# Patient Record
Sex: Female | Born: 1950 | Race: White | Hispanic: No | Marital: Single | Smoking: Former smoker
Health system: Southern US, Community
[De-identification: ages and names within clinical notes are randomized; demographics above are authoritative.]

## PROBLEM LIST (undated history)

## (undated) DIAGNOSIS — I10 Essential (primary) hypertension: Secondary | ICD-10-CM

## (undated) DIAGNOSIS — F32A Depression, unspecified: Secondary | ICD-10-CM

## (undated) DIAGNOSIS — E8801 Alpha-1-antitrypsin deficiency: Secondary | ICD-10-CM

## (undated) DIAGNOSIS — F329 Major depressive disorder, single episode, unspecified: Secondary | ICD-10-CM

## (undated) DIAGNOSIS — E785 Hyperlipidemia, unspecified: Secondary | ICD-10-CM

## (undated) DIAGNOSIS — G4733 Obstructive sleep apnea (adult) (pediatric): Secondary | ICD-10-CM

## (undated) DIAGNOSIS — K219 Gastro-esophageal reflux disease without esophagitis: Secondary | ICD-10-CM

## (undated) DIAGNOSIS — G43909 Migraine, unspecified, not intractable, without status migrainosus: Secondary | ICD-10-CM

## (undated) DIAGNOSIS — K5090 Crohn's disease, unspecified, without complications: Secondary | ICD-10-CM

## (undated) HISTORY — DX: Crohn's disease, unspecified, without complications: K50.90

## (undated) HISTORY — DX: Migraine, unspecified, not intractable, without status migrainosus: G43.909

## (undated) HISTORY — DX: Gastro-esophageal reflux disease without esophagitis: K21.9

## (undated) HISTORY — PX: TONSILLECTOMY: SUR1361

## (undated) HISTORY — DX: Essential (primary) hypertension: I10

## (undated) HISTORY — DX: Obstructive sleep apnea (adult) (pediatric): G47.33

## (undated) HISTORY — PX: TOTAL ABDOMINAL HYSTERECTOMY: SHX209

## (undated) HISTORY — DX: Major depressive disorder, single episode, unspecified: F32.9

## (undated) HISTORY — PX: COLONOSCOPY: SHX174

## (undated) HISTORY — DX: Hyperlipidemia, unspecified: E78.5

## (undated) HISTORY — DX: Alpha-1-antitrypsin deficiency: E88.01

## (undated) HISTORY — DX: Depression, unspecified: F32.A

---

## 2004-12-16 LAB — PULMONARY FUNCTION TEST

## 2007-07-25 HISTORY — PX: ACHILLES TENDON REPAIR: SUR1153

## 2008-11-05 LAB — PULMONARY FUNCTION TEST

## 2010-06-09 ENCOUNTER — Emergency Department (HOSPITAL_COMMUNITY): Admission: EM | Admit: 2010-06-09 | Discharge: 2010-06-10 | Payer: Self-pay | Admitting: Emergency Medicine

## 2010-06-10 ENCOUNTER — Ambulatory Visit: Payer: Self-pay | Admitting: Psychiatry

## 2010-06-10 ENCOUNTER — Inpatient Hospital Stay (HOSPITAL_COMMUNITY): Admission: AD | Admit: 2010-06-10 | Discharge: 2010-06-12 | Payer: Self-pay | Admitting: Psychiatry

## 2010-06-15 ENCOUNTER — Encounter: Admission: RE | Admit: 2010-06-15 | Discharge: 2010-06-15 | Payer: Self-pay | Admitting: Internal Medicine

## 2010-10-04 LAB — RAPID URINE DRUG SCREEN, HOSP PERFORMED
Amphetamines: NOT DETECTED
Cocaine: NOT DETECTED
Opiates: NOT DETECTED
Tetrahydrocannabinol: NOT DETECTED

## 2010-10-04 LAB — DIFFERENTIAL
Basophils Absolute: 0.1 10*3/uL (ref 0.0–0.1)
Basophils Relative: 1 % (ref 0–1)
Monocytes Absolute: 0.5 10*3/uL (ref 0.1–1.0)
Neutro Abs: 7.6 10*3/uL (ref 1.7–7.7)
Neutrophils Relative %: 72 % (ref 43–77)

## 2010-10-04 LAB — GLUCOSE, CAPILLARY
Glucose-Capillary: 112 mg/dL — ABNORMAL HIGH (ref 70–99)
Glucose-Capillary: 161 mg/dL — ABNORMAL HIGH (ref 70–99)

## 2010-10-04 LAB — BASIC METABOLIC PANEL
BUN: 15 mg/dL (ref 6–23)
Calcium: 9.6 mg/dL (ref 8.4–10.5)
Creatinine, Ser: 0.82 mg/dL (ref 0.4–1.2)
GFR calc non Af Amer: >60 mL/min (ref >60)
Glucose, Bld: 114 mg/dL — ABNORMAL HIGH (ref 70–99)

## 2010-10-04 LAB — CBC
MCHC: 35 g/dL (ref 30.0–36.0)
Platelets: 291 10*3/uL (ref 150–400)
RDW: 12.9 % (ref 11.5–15.5)

## 2011-03-31 ENCOUNTER — Ambulatory Visit (INDEPENDENT_AMBULATORY_CARE_PROVIDER_SITE_OTHER): Payer: BC Managed Care – PPO | Admitting: Pulmonary Disease

## 2011-03-31 ENCOUNTER — Encounter: Payer: Self-pay | Admitting: Pulmonary Disease

## 2011-03-31 VITALS — BP 110/74 | HR 87 | Temp 98.1°F | Ht 66.0 in | Wt 239.8 lb

## 2011-03-31 DIAGNOSIS — G4733 Obstructive sleep apnea (adult) (pediatric): Secondary | ICD-10-CM

## 2011-03-31 DIAGNOSIS — E8801 Alpha-1-antitrypsin deficiency: Secondary | ICD-10-CM

## 2011-03-31 NOTE — Patient Instructions (Signed)
Will send an order to your dme to get you a new mask Work on weight loss Will schedule you for pfts in the next 2-3 weeks, and see you back same day to review Please try and bring records regarding your pulmonary issues for me to review.

## 2011-03-31 NOTE — Progress Notes (Signed)
Subjective:    Patient ID: Jasmine Holt, female    DOB: 22-Mar-1951, 60 y.o.   MRN: [id number]  HPI The patient is a 60 year old female who comes in today as a self referral for management of obstructive sleep apnea.  She was diagnosed with severe sleep apnea in 2003, and started on CPAP with an auto device.  She is been doing very well on CPAP, and feels that she sleeps well with good alertness during the day.  Her current machine is 60 years old, she has a heated humidifier, and uses a nasal mask that is due for replacement.  Her current DME would not replace her mask without a new sleep study.  The patient wishes to establish with a sleep physician to help facilitate getting her supplies.  Again, she is very satisfied with her current treatment, and her weight has been stable over the years.  Her Epworth score today is only 4.  The patient also tells me that she has an alpha one antitrypsin deficiency.  She has asked if I would follow her for this as well.  She has not had recent PFTs, and therefore will schedule these just before her next visit.  We'll then address her pulmonary issues at that time.  Sleep Questionnaire: What time do you typically go to bed?( Between what hours) 9 pm to 10 pm How long does it take you to fall asleep? usually not long How many times during the night do you wake up? 8 What time do you get out of bed to start your day? 0454 Do you drive or operate heavy machinery in your occupation? No How much has your weight changed (up or down) over the past two years? (In pounds) 0 oz (0 kg) Have you ever had a sleep study before? Yes If yes, location of study? [place], [place] If yes, date of study? 2003 and 2007 Do you currently use CPAP? Yes If so, what pressure? auto pressure Do you wear oxygen at any time? No     Review of Systems  Constitutional: Negative for fever and unexpected weight change.  HENT: Positive for congestion and sneezing. Negative for ear pain, nosebleeds, sore  throat, rhinorrhea, trouble swallowing, dental problem, postnasal drip and sinus pressure.   Eyes: Negative for redness and itching.  Respiratory: Negative for cough, chest tightness, shortness of breath and wheezing.   Cardiovascular: Negative for palpitations and leg swelling.  Gastrointestinal: Negative for nausea and vomiting.  Genitourinary: Negative for dysuria.  Musculoskeletal: Negative for joint swelling.  Skin: Negative for rash.  Neurological: Positive for headaches.  Hematological: Does not bruise/bleed easily.  Psychiatric/Behavioral: Positive for dysphoric mood. The patient is nervous/anxious.        Objective:   Physical Exam Constitutional:  Obese female, no acute distress  HENT:  Nares patent without discharge  Oropharynx without exudate, palate and uvula are thick and elongated  Eyes:  Perrla, eomi, no scleral icterus  Neck:  No JVD, no TMG  Cardiovascular:  Normal rate, regular rhythm, no rubs or gallops.  No murmurs        Intact distal pulses  Pulmonary :  Normal breath sounds, no stridor or respiratory distress   No rales, rhonchi, or wheezing  Abdominal:  Soft, nondistended, bowel sounds present.  No tenderness noted.   Musculoskeletal:  No lower extremity edema noted.  Lymph Nodes:  No cervical lymphadenopathy noted  Skin:  No cyanosis noted  Neurologic:  Alert, appropriate, moves all 4 extremities without obvious  deficit.         Assessment & Plan:

## 2011-03-31 NOTE — Assessment & Plan Note (Signed)
The patient has a history of severe sleep apnea, and has been on CPAP since 2003.  She has been doing very well with the device, and has kept up with supplies and mask changes.  She is currently due for a new mask.  She feels that she is sleeping very well with the device, and is satisfied with her daytime alertness.  She is trying to work hard on weight reduction.  I will go ahead and send an order to her DME for a new mask, and have told her that I see sleep apnea patients yearly if they are doing well.

## 2011-04-01 ENCOUNTER — Encounter: Payer: Self-pay | Admitting: Pulmonary Disease

## 2011-04-21 ENCOUNTER — Ambulatory Visit: Payer: BC Managed Care – PPO | Admitting: Pulmonary Disease

## 2011-04-26 ENCOUNTER — Telehealth: Payer: Self-pay | Admitting: Pulmonary Disease

## 2011-04-26 NOTE — Telephone Encounter (Signed)
Noted  

## 2011-04-26 NOTE — Telephone Encounter (Signed)
Will forward to KC as FYI 

## 2011-05-05 ENCOUNTER — Encounter: Payer: Self-pay | Admitting: Pulmonary Disease

## 2011-05-05 ENCOUNTER — Ambulatory Visit (INDEPENDENT_AMBULATORY_CARE_PROVIDER_SITE_OTHER): Payer: BC Managed Care – PPO | Admitting: Pulmonary Disease

## 2011-05-05 DIAGNOSIS — E8801 Alpha-1-antitrypsin deficiency: Secondary | ICD-10-CM | POA: Insufficient documentation

## 2011-05-05 DIAGNOSIS — G4733 Obstructive sleep apnea (adult) (pediatric): Secondary | ICD-10-CM

## 2011-05-05 LAB — PULMONARY FUNCTION TEST

## 2011-05-05 NOTE — Patient Instructions (Signed)
Continue with cpap, and work on weight loss followup with me in one year, but call if any issues with cpap or breathing.

## 2011-05-05 NOTE — Progress Notes (Signed)
PFT done today. 

## 2011-05-05 NOTE — Progress Notes (Signed)
  Subjective:    Patient ID: Jasmine Holt, female    DOB: [birth year], 60 y.o.   MRN: [id number]  HPI Patient comes in today for followup of her pulmonary function studies.  She has known alpha-1 antitrypsin deficiency, and has been followed by a pulmonologist in [place] for many years.  She has moved to [facility] and needs close followup.  Her PFTs today showed no airflow obstruction, no restriction, and a very mild decrease in diffusion capacity.  I have reviewed this study with her in detail, and answered all of her questions.   Review of Systems  Constitutional: Negative for fever and unexpected weight change.  HENT: Negative for ear pain, nosebleeds, congestion, sore throat, rhinorrhea, sneezing, trouble swallowing, dental problem, postnasal drip and sinus pressure.   Eyes: Negative for redness and itching.  Respiratory: Negative for cough, chest tightness, shortness of breath and wheezing.   Cardiovascular: Negative for palpitations and leg swelling.  Gastrointestinal: Negative for nausea and vomiting.  Genitourinary: Negative for dysuria.  Musculoskeletal: Negative for joint swelling.  Skin: Negative for rash.  Neurological: Negative for headaches.  Hematological: Does not bruise/bleed easily.  Psychiatric/Behavioral: Negative for dysphoric mood. The patient is not nervous/anxious.        Objective:   Physical Exam Obese female in no acute distress No skin breakdown or pressure necrosis from the CPAP mask No significant lower extremity edema, no cyanosis Alert and oriented, does not appear to be sleepy, moves all 4 extremities.      Assessment & Plan:

## 2011-05-05 NOTE — Assessment & Plan Note (Signed)
The patient is doing well with her CPAP since getting a new mask.  She feels that she sleeps well, and denies significant daytime sleepiness.  I have asked her to continue with CPAP, and to work aggressively on weight loss.

## 2011-05-05 NOTE — Assessment & Plan Note (Addendum)
We have received her records that have verified that she does have alpha-1 antitrypsin deficiency.  She has had no airflow obstruction by prior PFTs, and her study today shows no airflow obstruction either.  She understands that she will need  yearly testing to follow this very closely.  She should also have GI followup for this problem, as well as her known Crohn's disease.  I have given the patient a list of names for gastroenterologists here in [place].

## 2011-05-26 ENCOUNTER — Other Ambulatory Visit: Payer: Self-pay | Admitting: Internal Medicine

## 2011-05-26 DIAGNOSIS — Z1231 Encounter for screening mammogram for malignant neoplasm of breast: Secondary | ICD-10-CM

## 2011-06-21 ENCOUNTER — Ambulatory Visit
Admission: RE | Admit: 2011-06-21 | Discharge: 2011-06-21 | Disposition: A | Discharge status: Home / Self Care | Payer: BC Managed Care – PPO | Source: Ambulatory Visit | Attending: Internal Medicine | Admitting: Internal Medicine

## 2011-06-21 DIAGNOSIS — Z1231 Encounter for screening mammogram for malignant neoplasm of breast: Secondary | ICD-10-CM

## 2011-08-01 ENCOUNTER — Telehealth: Payer: Self-pay | Admitting: Pulmonary Disease

## 2011-08-01 NOTE — Telephone Encounter (Signed)
[id number] x1 

## 2011-08-02 NOTE — Telephone Encounter (Signed)
LMTCB x2  

## 2011-08-02 NOTE — Telephone Encounter (Signed)
Spoke with Rosann Auerbach and she is to call the pt with information on how to proceed with getting the records that she needs sent to ortho. Thanks Trish!

## 2011-08-02 NOTE — Telephone Encounter (Addendum)
Spoke with pt. She states that she has signed a release to get her old sleep studies faxed to her ortho doc. She states that they still have not received anything and she has tried to reach medical records several times with no help. I advised will call and see what the hold up is.  Called and spoke with Trish in [facility]. She states that she will check into this and call us back today

## 2012-01-02 ENCOUNTER — Encounter (HOSPITAL_BASED_OUTPATIENT_CLINIC_OR_DEPARTMENT_OTHER)
Admission: RE | Admit: 2012-01-02 | Discharge: 2012-01-02 | Disposition: A | Discharge status: Home / Self Care | Payer: BC Managed Care – PPO | Source: Ambulatory Visit | Attending: Orthopedic Surgery | Admitting: Orthopedic Surgery

## 2012-01-02 ENCOUNTER — Encounter (HOSPITAL_BASED_OUTPATIENT_CLINIC_OR_DEPARTMENT_OTHER): Payer: Self-pay | Admitting: *Deleted

## 2012-01-02 LAB — BASIC METABOLIC PANEL
CO2: 26 mEq/L (ref 19–32)
Calcium: 9.9 mg/dL (ref 8.4–10.5)
Chloride: 100 mEq/L (ref 96–112)
Sodium: 136 mEq/L (ref 135–145)

## 2012-01-02 NOTE — Progress Notes (Signed)
To come in for bmet-ekg-bring all meds and cpap dos-and overnight bag in case she needs to stay

## 2012-01-03 NOTE — H&P (Signed)
Keiri Solano/[facility] [address]   [address] [place], [place] [address] [phone] A Division of [facility]  Loreta Ave, M.D.     Robert A. Thurston Hole, M.D.     Lunette Stands, M.D. Eulas Post, M.D.    Buford Dresser, M.D. Estell Harpin, M.D. Ralene Cork, D.O.          Genene Churn. Barry Dienes, PA-C            Kirstin A. Shepperson, PA-C Janace Litten, OPA-C   RE: Jasmine Holt                                [id number]      DOB: 10-28-1950 INITIAL EVALUATION:  06-02-11 Jasmine Holt is a new patient to the office, presenting for my opinion and treatment recommendation to continue to follow her for an issue with her left heel.  Previously lived in [place], now living in the [place] area.  Presents with chronic worsening pain, longstanding, left heel at the Achilles attachment.  Gradual onset.  Getting worse and worse.  A 10/10 pain, especially in the morning.  Improves a little bit during the day, but then gets worse.  She has done everything with alteration of footwear and a stretching program without improvement.  Symptoms are all pre Achilles over the Achilles attachment and really not down over the plantar fascia.  No numbness.  No tingling.  She had seen Dr. Victorino Dike recently, I do not have his notes, but she stated that she didn't feel that he gave her attention to the matter and reasonable treatment recommendation.   Her entire history is reviewed, updated and included in the chart.  This is significant for the exact same problem in her right heel a number of years ago.  I don't have those notes, but she is a good historian.  She had a podiatrist do a pump bump excision, followed by another pump bump excision in 2008.  Eventually saw an orthopedic surgeon who did a complete takedown, debridement and primary repair of her Achilles to the os calcis.  From her description and x-rays this looks like a speed bridge type re-implantation at  the os calcis.  That worked well and although it took her a fair amount of time she has gotten over that and she is improved.  Although she still has some stiffness and soreness, she is doing well on that side.  She fully realizes it is probably going to come down to that same procedure on this side.    EXAMINATION: General exam is outlined and included in the chart.  Height: 5?6.  Weight: 244 pounds.  Neurovascularly intact upper and lower extremities.  On the left symptomatic side she has exquisite tenderness over the Achilles attachment to the os calcis.  Some pre Achilles bursitis and tenderness over a pump bump, which is prominent.  Not a lot of plantar fascial tenderness.  Good ankle motion and stability.  Peroneal tendons and posterior tib tendon intact.  With her knee extended dorsiflexion is 5-7 degrees.  On the opposite left she has a well healed incision.  She has no tenderness on the Achilles or plantar fascia.  Dorsiflexion there is definitely better at 10-15 degrees.  Fairly good strength.  Feels like she has a good repair.     X-RAYS: X-rays are obtained of both heels.  A generous excision of the  pump bump and then tracks from Bio-Absorbable anchors from her Achilles repair on the right.  She has a plantar fascial spur which is old and chronic on the right.  X-rays of the left show a prominent pump bump with ossification up into the Achilles at the attachment site.  She also has the same spur there at the plantar fascia.    DISPOSITION:  Progressive signs and symptoms of Achilles attachment irritation and probable progression to some degree of tearing.  Pre Achilles bursitis and pump bump as well.  Based on longevity of symptoms I doubt this is going to resolve short of intervention and she understands that.  I have discussed evaluation with an MRI scan to look at her Achilles tendon to further evaluate this.  She understands and agrees.  She will follow up with me when that is complete.  We  have already touched upon the possibility of progression to a posterior approach takedown of pump bump, excise pre-Achilles bursa, debride and repair the Achilles back to the os calcis.    Loreta Ave, M.D. Electronically verified by Loreta Ave, M.D. DFM:jjh D 06-02-11 T 06-05-11 Haru Anspaugh/[facility] [address]   [address] [place], [place] [address] [phone] A Division of [facility]  Loreta Ave, M.D.     Robert A. Thurston Hole, M.D.     Lunette Stands, M.D. Eulas Post, M.D.    Buford Dresser, M.D. Estell Harpin, M.D. Ralene Cork, D.O.          Genene Churn. Barry Dienes, PA-C            Kirstin A. Shepperson, PA-C [place], OPA-C   RE: Jasmine Holt                                [id number]      DOB: 1951/01/09 PROGRESS NOTE: 12-01-11 [name] comes in for follow up.  Her work status has gotten to a point where she can proceed with definitive treatment of her left heel.  This has been reviewed with her at length in the past.  She has a prominent pump bump.  Pre Achilles bursitis, as well as distal Achilles attachment tendinopathy and partial tearing.  She has been through a similar approach in the opposite side where it was done actually in three procedures.  On this left symptomatic side we once again sat down discussed and reviewed continued symptoms and plans.  More than 15 minutes spent face-to-face covering all of this.  General anesthesia.  Prone position.  Removal of pump bump in pre Achilles bursa.  Take down the Achilles with a double row repair.  What to expect intra and post-op reviewed.  Amount of time for protection and healing reviewed.  All questions answered.  I will see her at the time of operative intervention.    Loreta Ave, M.D.   Electronically verified by Loreta Ave, M.D. DFM:jjh D 12-01-11 T 12-04-11

## 2012-01-04 ENCOUNTER — Encounter (HOSPITAL_BASED_OUTPATIENT_CLINIC_OR_DEPARTMENT_OTHER)
Admission: RE | Disposition: A | Discharge status: Home / Self Care | Payer: Self-pay | Source: Ambulatory Visit | Attending: Orthopedic Surgery

## 2012-01-04 ENCOUNTER — Encounter (HOSPITAL_BASED_OUTPATIENT_CLINIC_OR_DEPARTMENT_OTHER): Payer: Self-pay | Admitting: Anesthesiology

## 2012-01-04 ENCOUNTER — Ambulatory Visit (HOSPITAL_BASED_OUTPATIENT_CLINIC_OR_DEPARTMENT_OTHER): Payer: BC Managed Care – PPO | Admitting: Anesthesiology

## 2012-01-04 ENCOUNTER — Encounter (HOSPITAL_BASED_OUTPATIENT_CLINIC_OR_DEPARTMENT_OTHER): Payer: Self-pay | Admitting: *Deleted

## 2012-01-04 ENCOUNTER — Ambulatory Visit (HOSPITAL_BASED_OUTPATIENT_CLINIC_OR_DEPARTMENT_OTHER)
Admission: RE | Admit: 2012-01-04 | Discharge: 2012-01-04 | Disposition: A | Discharge status: Home / Self Care | Payer: BC Managed Care – PPO | Source: Ambulatory Visit | Attending: Orthopedic Surgery | Admitting: Orthopedic Surgery

## 2012-01-04 DIAGNOSIS — E119 Type 2 diabetes mellitus without complications: Secondary | ICD-10-CM | POA: Insufficient documentation

## 2012-01-04 DIAGNOSIS — M7730 Calcaneal spur, unspecified foot: Secondary | ICD-10-CM | POA: Insufficient documentation

## 2012-01-04 DIAGNOSIS — Z4789 Encounter for other orthopedic aftercare: Secondary | ICD-10-CM

## 2012-01-04 DIAGNOSIS — I10 Essential (primary) hypertension: Secondary | ICD-10-CM | POA: Insufficient documentation

## 2012-01-04 DIAGNOSIS — Z01810 Encounter for preprocedural cardiovascular examination: Secondary | ICD-10-CM | POA: Insufficient documentation

## 2012-01-04 DIAGNOSIS — M7660 Achilles tendinitis, unspecified leg: Secondary | ICD-10-CM | POA: Insufficient documentation

## 2012-01-04 HISTORY — PX: ACHILLES TENDON SURGERY: SHX542

## 2012-01-04 LAB — GLUCOSE, CAPILLARY
Glucose-Capillary: 122 mg/dL — ABNORMAL HIGH (ref 70–99)
Glucose-Capillary: 109 mg/dL — ABNORMAL HIGH (ref 70–99)

## 2012-01-04 LAB — POCT HEMOGLOBIN-HEMACUE: Hemoglobin: 12.4 g/dL (ref 12.0–15.0)

## 2012-01-04 SURGERY — REPAIR, TENDON, ACHILLES
Anesthesia: General | Site: Ankle | Laterality: Left | Wound class: Clean

## 2012-01-04 MED ORDER — FENTANYL CITRATE 0.05 MG/ML IJ SOLN
INTRAMUSCULAR | Status: DC | PRN
  Administered 2012-01-04 (×2): 25 ug via INTRAVENOUS

## 2012-01-04 MED ORDER — FENTANYL CITRATE 0.05 MG/ML IJ SOLN
50.0000 ug | INTRAMUSCULAR | Status: AC | PRN
  Administered 2012-01-04 (×2): 50 ug via INTRAVENOUS

## 2012-01-04 MED ORDER — DEXAMETHASONE SODIUM PHOSPHATE 4 MG/ML IJ SOLN
INTRAMUSCULAR | Status: DC | PRN
  Administered 2012-01-04: 10 mg via INTRAVENOUS

## 2012-01-04 MED ORDER — LACTATED RINGERS IV SOLN
INTRAVENOUS | Status: DC
  Administered 2012-01-04: 07:00:00 via INTRAVENOUS

## 2012-01-04 MED ORDER — BUPIVACAINE-EPINEPHRINE PF 0.5-1:200000 % IJ SOLN
INTRAMUSCULAR | Status: DC | PRN
  Administered 2012-01-04: 30 mL

## 2012-01-04 MED ORDER — ONDANSETRON HCL 4 MG/2ML IJ SOLN: 4.0000 mg | Freq: Once | INTRAMUSCULAR | Status: DC | PRN

## 2012-01-04 MED ORDER — HYDROMORPHONE HCL PF 1 MG/ML IJ SOLN
0.2500 mg | INTRAMUSCULAR | Status: DC | PRN
  Administered 2012-01-04 (×4): 0.25 mg via INTRAVENOUS

## 2012-01-04 MED ORDER — ONDANSETRON HCL 4 MG/2ML IJ SOLN
INTRAMUSCULAR | Status: DC | PRN
  Administered 2012-01-04: 4 mg via INTRAVENOUS

## 2012-01-04 MED ORDER — SUCCINYLCHOLINE CHLORIDE 20 MG/ML IJ SOLN
INTRAMUSCULAR | Status: DC | PRN
  Administered 2012-01-04: 100 mg via INTRAVENOUS

## 2012-01-04 MED ORDER — CEFAZOLIN SODIUM-DEXTROSE 2-3 GM-% IV SOLR
2.0000 g | INTRAVENOUS | Status: AC
  Administered 2012-01-04: 2 g via INTRAVENOUS

## 2012-01-04 MED ORDER — MIDAZOLAM HCL 2 MG/2ML IJ SOLN
1.0000 mg | INTRAMUSCULAR | Status: AC | PRN
  Administered 2012-01-04 (×2): 1 mg via INTRAVENOUS

## 2012-01-04 MED ORDER — LIDOCAINE HCL (CARDIAC) 20 MG/ML IV SOLN
INTRAVENOUS | Status: DC | PRN
  Administered 2012-01-04: 60 mg via INTRAVENOUS

## 2012-01-04 MED ORDER — PROPOFOL 10 MG/ML IV EMUL
INTRAVENOUS | Status: DC | PRN
  Administered 2012-01-04: 200 mg via INTRAVENOUS

## 2012-01-04 SURGICAL SUPPLY — 67 items
BANDAGE ELASTIC 4 VELCRO ST LF (GAUZE/BANDAGES/DRESSINGS) ×2 IMPLANT
BANDAGE ELASTIC 6 VELCRO ST LF (GAUZE/BANDAGES/DRESSINGS) ×2 IMPLANT
BANDAGE ESMARK 6X9 LF (GAUZE/BANDAGES/DRESSINGS) ×1 IMPLANT
BLADE SURG 15 STRL LF DISP TIS (BLADE) ×2 IMPLANT
BLADE SURG 15 STRL SS (BLADE) ×2
BNDG COHESIVE 4X5 TAN STRL (GAUZE/BANDAGES/DRESSINGS) ×2 IMPLANT
BNDG ESMARK 6X9 LF (GAUZE/BANDAGES/DRESSINGS) ×2
CANISTER SUCTION 1200CC (MISCELLANEOUS) ×2 IMPLANT
CLOTH BEACON ORANGE TIMEOUT ST (SAFETY) ×2 IMPLANT
COVER TABLE BACK 60X90 (DRAPES) ×2 IMPLANT
CUFF TOURNIQUET SINGLE 34IN LL (TOURNIQUET CUFF) ×2 IMPLANT
DRAPE EXTREMITY T 121X128X90 (DRAPE) ×2 IMPLANT
DRAPE OEC MINIVIEW 54X84 (DRAPES) ×2 IMPLANT
DRAPE U 20/CS (DRAPES) ×2 IMPLANT
DRAPE U-SHAPE 47X51 STRL (DRAPES) ×2 IMPLANT
DRSG PAD ABDOMINAL 8X10 ST (GAUZE/BANDAGES/DRESSINGS) ×2 IMPLANT
DURAPREP 26ML APPLICATOR (WOUND CARE) ×2 IMPLANT
ELECT REM PT RETURN 9FT ADLT (ELECTROSURGICAL) ×2
ELECTRODE REM PT RTRN 9FT ADLT (ELECTROSURGICAL) ×1 IMPLANT
GAUZE XEROFORM 1X8 LF (GAUZE/BANDAGES/DRESSINGS) ×2 IMPLANT
GLOVE BIO SURGEON STRL SZ 6.5 (GLOVE) ×2 IMPLANT
GLOVE BIOGEL PI IND STRL 7.0 (GLOVE) ×1 IMPLANT
GLOVE BIOGEL PI IND STRL 8 (GLOVE) ×1 IMPLANT
GLOVE BIOGEL PI INDICATOR 7.0 (GLOVE) ×1
GLOVE BIOGEL PI INDICATOR 8 (GLOVE) ×1
GLOVE EXAM NITRILE PF MED BLUE (GLOVE) ×2 IMPLANT
GLOVE ORTHO TXT STRL SZ7.5 (GLOVE) ×4 IMPLANT
GOWN BRE IMP PREV XXLGXLNG (GOWN DISPOSABLE) ×2 IMPLANT
GOWN PREVENTION PLUS XLARGE (GOWN DISPOSABLE) ×4 IMPLANT
IMPL SYS BIOCOMP ACH SPEED (Anchor) ×1 IMPLANT
IMPLANT SYS BIOCOMP ACH SPEED (Anchor) ×2 IMPLANT
NEEDLE 1/2 CIR CATGUT .05X1.09 (NEEDLE) IMPLANT
NEEDLE HYPO 22GX1.5 SAFETY (NEEDLE) IMPLANT
PACK BASIN DAY SURGERY FS (CUSTOM PROCEDURE TRAY) ×2 IMPLANT
PAD CAST 4YDX4 CTTN HI CHSV (CAST SUPPLIES) ×1 IMPLANT
PADDING CAST ABS 4INX4YD NS (CAST SUPPLIES) ×1
PADDING CAST ABS COTTON 4X4 ST (CAST SUPPLIES) ×1 IMPLANT
PADDING CAST COTTON 4X4 STRL (CAST SUPPLIES) ×1
PADDING CAST COTTON 6X4 STRL (CAST SUPPLIES) ×2 IMPLANT
PENCIL BUTTON HOLSTER BLD 10FT (ELECTRODE) ×2 IMPLANT
SLEEVE SCD COMPRESS KNEE MED (MISCELLANEOUS) ×2 IMPLANT
SPLINT FAST PLASTER 5X30 (CAST SUPPLIES)
SPLINT FIBERGLASS 3X35 (CAST SUPPLIES) ×2 IMPLANT
SPLINT FIBERGLASS 4X30 (CAST SUPPLIES) IMPLANT
SPLINT PLASTER CAST FAST 5X30 (CAST SUPPLIES) IMPLANT
SPONGE GAUZE 4X4 12PLY (GAUZE/BANDAGES/DRESSINGS) ×2 IMPLANT
STAPLER VISISTAT 35W (STAPLE) ×2 IMPLANT
STOCKINETTE 6  STRL (DRAPES) ×1
STOCKINETTE 6 STRL (DRAPES) ×1 IMPLANT
SUCTION FRAZIER TIP 10 FR DISP (SUCTIONS) ×2 IMPLANT
SUT ETHILON 3 0 PS 1 (SUTURE) IMPLANT
SUT FIBERWIRE #2 38 T-5 BLUE (SUTURE)
SUT VIC AB 0 CT1 27 (SUTURE) ×1
SUT VIC AB 0 CT1 27XBRD ANBCTR (SUTURE) ×1 IMPLANT
SUT VIC AB 2-0 SH 27 (SUTURE) ×1
SUT VIC AB 2-0 SH 27XBRD (SUTURE) ×1 IMPLANT
SUT VIC AB 3-0 SH 27 (SUTURE)
SUT VIC AB 3-0 SH 27X BRD (SUTURE) IMPLANT
SUTURE FIBERWR #2 38 T-5 BLUE (SUTURE) IMPLANT
SYR BULB 3OZ (MISCELLANEOUS) ×2 IMPLANT
SYR CONTROL 10ML LL (SYRINGE) IMPLANT
TOWEL OR 17X24 6PK STRL BLUE (TOWEL DISPOSABLE) ×4 IMPLANT
TOWEL OR NON WOVEN STRL DISP B (DISPOSABLE) ×2 IMPLANT
TUBE CONNECTING 20X1/4 (TUBING) ×2 IMPLANT
UNDERPAD 30X30 INCONTINENT (UNDERPADS AND DIAPERS) ×2 IMPLANT
WATER STERILE IRR 1000ML POUR (IV SOLUTION) IMPLANT
YANKAUER SUCT BULB TIP NO VENT (SUCTIONS) ×2 IMPLANT

## 2012-01-04 NOTE — Anesthesia Procedure Notes (Addendum)
Anesthesia Regional Block:  Popliteal block  Pre-Anesthetic Checklist: ,, timeout performed, Correct Patient, Correct Site, Correct Laterality, Correct Procedure, Correct Position, site marked, Risks and benefits discussed,  Surgical consent,  Pre-op evaluation,  At surgeon's request and post-op pain management  Laterality: Left and Lower  Prep: chloraprep       Needles:  Injection technique: Single-shot  Needle Type: Echogenic Needle     Needle Length: 9cm  Needle Gauge: 22 and 22 G    Additional Needles:  Procedures: ultrasound guided Popliteal block Narrative:  Start time: 01/04/2012 7:15 AM End time: 01/04/2012 7:25 AM Injection made incrementally with aspirations every 5 mL.  Performed by: Personally  Anesthesiologist: Sheldon Silvan, MD  Additional Notes: Marcaine 0.5% with EPI 1:200000  Femoral nerve block Procedure Name: Intubation Performed by: York Grice Pre-anesthesia Checklist: Patient identified, Timeout performed, Emergency Drugs available, Suction available and Patient being monitored Patient Re-evaluated:Patient Re-evaluated prior to inductionOxygen Delivery Method: Circle system utilized Preoxygenation: Pre-oxygenation with 100% oxygen Intubation Type: IV induction Ventilation: Mask ventilation without difficulty Grade View: Grade I Tube type: Oral Tube size: 7.0 mm Number of attempts: 1 Airway Equipment and Method: Video-laryngoscopy Placement Confirmation: breath sounds checked- equal and bilateral,  ETT inserted through vocal cords under direct vision and positive ETCO2 Secured at: 21 cm Tube secured with: Tape Dental Injury: Teeth and Oropharynx as per pre-operative assessment

## 2012-01-04 NOTE — Interval H&P Note (Signed)
History and Physical Interval Note:  01/04/2012 7:35 AM  Jasmine Holt  has presented today for surgery, with the diagnosis of l. 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 After consideration of risks, benefits and other options for treatment, the patient has consented to  Procedure(s) (LRB): ACHILLES TENDON REPAIR (Left) as a surgical intervention .  The patients' history has been reviewed, patient examined, no change in status, stable for surgery.  I have reviewed the patients' chart and labs.  Questions were answered to the patient's satisfaction.     Cassandra Mcmanaman F

## 2012-01-04 NOTE — Progress Notes (Signed)
Assisted Dr. Crews with left, ultrasound guided, popliteal block. Side rails up, monitors on throughout procedure. See vital signs in flow sheet. Tolerated Procedure well. 

## 2012-01-04 NOTE — Anesthesia Preprocedure Evaluation (Signed)
Anesthesia Evaluation  Patient identified by MRN, date of birth, ID band Patient awake    Reviewed: Allergy & Precautions, H&P , NPO status , Patient's Chart, lab work & pertinent test results  Airway Mallampati: II TM Distance: >3 FB     Dental  (+) Teeth Intact and Dental Advisory Given   Pulmonary  breath sounds clear to auscultation        Cardiovascular hypertension, Pt. on medications Rhythm:Regular Rate:Normal     Neuro/Psych    GI/Hepatic   Endo/Other  Diabetes mellitus-, Type 2, Oral Hypoglycemic AgentsMorbid obesity  Renal/GU      Musculoskeletal   Abdominal   Peds  Hematology   Anesthesia Other Findings   Reproductive/Obstetrics                           Anesthesia Physical Anesthesia Plan  ASA: III  Anesthesia Plan: General   Post-op Pain Management:    Induction: Intravenous  Airway Management Planned: Oral ETT  Additional Equipment:   Intra-op Plan:   Post-operative Plan: Extubation in OR  Informed Consent: I have reviewed the patients History and Physical, chart, labs and discussed the procedure including the risks, benefits and alternatives for the proposed anesthesia with the patient or authorized representative who has indicated his/her understanding and acceptance.   Dental advisory given  Plan Discussed with: CRNA, Anesthesiologist and Surgeon  Anesthesia Plan Comments:         Anesthesia Quick Evaluation

## 2012-01-04 NOTE — Brief Op Note (Signed)
01/04/2012  9:05 AM  PATIENT:  Jasmine Holt  61 y.o. 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

## 2012-01-04 NOTE — Anesthesia Postprocedure Evaluation (Signed)
  Anesthesia Post-op Note  Patient: Jasmine Holt  Procedure(s) Performed: Procedure(s) (LRB): ACHILLES TENDON REPAIR (Left)  Patient Location: PACU  Anesthesia Type: GA combined with regional for post-op pain  Level of Consciousness: awake, alert  and oriented  Airway and Oxygen Therapy: Patient Spontanous Breathing and Patient connected to face mask oxygen  Post-op Pain: mild  Post-op Assessment: Post-op Vital signs reviewed  Post-op Vital Signs: Reviewed  Complications: No apparent anesthesia complications

## 2012-01-04 NOTE — Transfer of Care (Signed)
Immediate Anesthesia Transfer of Care Note  Patient: Jasmine Holt  Procedure(s) Performed: Procedure(s) (LRB): ACHILLES TENDON REPAIR (Left)  Patient Location: PACU  Anesthesia Type: General  Level of Consciousness: awake and alert   Airway & Oxygen Therapy: Patient Spontanous Breathing and Patient connected to face mask oxygen  Post-op Assessment: Report given to PACU RN and Post -op Vital signs reviewed and stable  Post vital signs: Reviewed and stable  Complications: No apparent anesthesia complications

## 2012-01-04 NOTE — Discharge Instructions (Addendum)
Regional Anesthesia Blocks ° °1. Numbness or the inability to move the "blocked" extremity may last from 3-48 hours after placement. The length of time depends on the medication injected and your individual response to the medication. If the numbness is not going away after 48 hours, call your surgeon. ° °2. The extremity that is blocked will need to be protected until the numbness is gone and the  Strength has returned. Because you cannot feel it, you will need to take extra care to avoid injury. Because it may be weak, you may have difficulty moving it or using it. You may not know what position it is in without looking at it while the block is in effect. ° °3. For blocks in the legs and feet, returning to weight bearing and walking needs to be done carefully. You will need to wait until the numbness is entirely gone and the strength has returned. You should be able to move your leg and foot normally before you try and bear weight or walk. You will need someone to be with you when you first try to ensure you do not fall and possibly risk injury. ° °4. Bruising and tenderness at the needle site are common side effects and will resolve in a few days. ° °5. Persistent numbness or new problems with movement should be communicated to the surgeon or the [facility] ([phone])/ [facility] ([phone]). ° ° ° °Post Anesthesia Home Care Instructions ° °Activity: °Get plenty of rest for the remainder of the day. A responsible adult should stay with you for 24 hours following the procedure.  °For the next 24 hours, DO NOT: °-Drive a car °-Operate machinery °-Drink alcoholic beverages °-Take any medication unless instructed by your physician °-Make any legal decisions or sign important papers. ° °Meals: °Start with liquid foods such as gelatin or soup. Progress to regular foods as tolerated. Avoid greasy, spicy, heavy foods. If nausea and/or vomiting occur, drink only clear liquids until the  nausea and/or vomiting subsides. Call your physician if vomiting continues. ° °Special Instructions/Symptoms: °Your throat may feel dry or sore from the anesthesia or the breathing tube placed in your throat during surgery. If this causes discomfort, gargle with warm salt water. The discomfort should disappear within 24 hours. ° °

## 2012-01-05 NOTE — Op Note (Signed)
NAMECLELA, HAGADORN NO.:  [id number]  MEDICAL RECORD NO.:  [id number]  LOCATION:                               FACILITY:  [facility]  PHYSICIAN:  Loreta Ave, M.D. DATE OF BIRTH:  May 17, 1951  DATE OF PROCEDURE:  01/04/2012 DATE OF DISCHARGE:                              OPERATIVE REPORT   PREOPERATIVE DIAGNOSES: 1. Left heel chronic Achilles tendonitis with partial tearing at os     calcis attachment with spur formation up into the tendon. 2. Prominent posterosuperior os calcis (pump, bump). 3. Pre-Achilles bursitis.  POSTOPERATIVE DIAGNOSES: 1. Left heel chronic Achilles tendonitis with partial tearing at os     calcis attachment with spur formation up into the tendon. 2. Prominent posterosuperior os calcis (pump, bump). 3. Pre-Achilles bursitis.  PROCEDURE:  Left heel: 1. Takedown debridement and then primary repair of Achilles tendon at     the os calcis attachment with an Arthrex speed bridge technique,     FiberWire suture with 4 Bio-anchors. 2. Debridement of pump bump and excision of pre-Achilles bursitis.  SURGEON:  Loreta Ave, M.D.  ASSISTANT:  Genene Churn. Barry Dienes, [place], present throughout the entire case and necessary for timely completion of procedure.  ANESTHESIA:  General.  BLOOD LOSS:  Minimal.  SPECIMENS:  None.  CULTURES:  None.  COMPLICATION:  None.  DRESSINGS:  Soft compressive.  TOURNIQUET TIME:  45 minutes.  PROCEDURE:  The patient was brought to the operating room, placed on the operating table in supine position.  After adequate anesthesia had been obtained, tourniquet applied.  Turned to a prone position, appropriate padding and support.  Exsanguinated with elevation, Esmarch.  Tourniquet inflated to 350 mmHg.  Longitudinal incision to lateral border of the Achilles down to the lateral side of the os calcis.  Skin and subcutaneous tissue divided.  Fair amount of inflammatory tissue around the tendon superficially  debrided.  Partial tearing, tendinosis, tendinopathy, spur formation up into the tendon distally.  This was divided longitudinally.  The tearing of the attachment then debrided out to expose the attachment of spurs, which were then removed. Compromising more than 75% of the tendon attachment.  Through the defect in the tendon, I excised the pre-Achilles bursa and contoured off the exostosis and symptomatic spurring.  Adequate excision and debridement confirmed fluoroscopically.  Wound irrigated.  I then put 2 rows of anchors in the os calcis.  The first 2 were placed at the proximal attachment of the os calcis.  FiberWire from the first 2 anchors were then placed through the Achilles tendon.  One of the limbs was brought up to close longitudinal defect and then all limbs brought out distally. These were then crossed and anchored into the 2nd row of predrilled SwiveLock anchors to give a nice double-row contouring and repair without any knots.  Nice firm Customer service manager confirmed.  This lied down smoothly and I could bring it through full motion without too much tension.  Wound irrigated, closed with nylon.  Sterile compressive dressing applied.  Short-leg splint applied.  Tourniquet deflated and removed.  Turned to supine position.  Anesthesia reversed.  Brought to recovery room.  Tolerated surgery well.  No complications.  Loreta Ave, M.D.     DFM/MEDQ  D:  01/04/2012  T:  01/04/2012  Job:  [id number]

## 2012-01-10 ENCOUNTER — Encounter (HOSPITAL_BASED_OUTPATIENT_CLINIC_OR_DEPARTMENT_OTHER): Payer: Self-pay | Admitting: Orthopedic Surgery

## 2012-05-20 ENCOUNTER — Other Ambulatory Visit: Payer: Self-pay | Admitting: Internal Medicine

## 2012-05-20 DIAGNOSIS — Z1231 Encounter for screening mammogram for malignant neoplasm of breast: Secondary | ICD-10-CM

## 2012-06-28 ENCOUNTER — Ambulatory Visit: Payer: BC Managed Care – PPO

## 2013-08-13 ENCOUNTER — Telehealth: Payer: Self-pay | Admitting: Pulmonary Disease

## 2013-08-13 NOTE — Telephone Encounter (Signed)
Pt is returning call.  Jasmine Holt ° °

## 2013-08-13 NOTE — Telephone Encounter (Signed)
Per last OV note: She has had no airflow obstruction by prior PFTs, and her study today shows no airflow obstruction either. She understands that she will need yearly testing to follow this very closely.  Pt has not had insurance so was unable to f/u until now. Appt has been set for 09-16-13 for pft and rov after. Jasmine CurieJennifer Castillo, CMA

## 2013-08-13 NOTE — Telephone Encounter (Signed)
Pt last seen 04-2011. Note states she needs yearly PFT but also needs visit. LMTCB to speak with the pt. Carron Curie, CMA

## 2013-08-15 ENCOUNTER — Encounter: Payer: Self-pay | Admitting: Physician Assistant

## 2013-08-15 ENCOUNTER — Ambulatory Visit (INDEPENDENT_AMBULATORY_CARE_PROVIDER_SITE_OTHER): Payer: PRIVATE HEALTH INSURANCE | Admitting: Physician Assistant

## 2013-08-15 VITALS — BP 130/72 | HR 76 | Temp 98.6°F | Ht 64.75 in | Wt 241.8 lb

## 2013-08-15 DIAGNOSIS — K5090 Crohn's disease, unspecified, without complications: Secondary | ICD-10-CM

## 2013-08-15 DIAGNOSIS — E785 Hyperlipidemia, unspecified: Secondary | ICD-10-CM

## 2013-08-15 DIAGNOSIS — Z23 Encounter for immunization: Secondary | ICD-10-CM

## 2013-08-15 DIAGNOSIS — E119 Type 2 diabetes mellitus without complications: Secondary | ICD-10-CM

## 2013-08-15 DIAGNOSIS — I10 Essential (primary) hypertension: Secondary | ICD-10-CM

## 2013-08-15 DIAGNOSIS — Z0000 Encounter for general adult medical examination without abnormal findings: Secondary | ICD-10-CM

## 2013-08-15 DIAGNOSIS — E8801 Alpha-1-antitrypsin deficiency: Secondary | ICD-10-CM

## 2013-08-15 DIAGNOSIS — R5381 Other malaise: Secondary | ICD-10-CM

## 2013-08-15 DIAGNOSIS — R5383 Other fatigue: Secondary | ICD-10-CM

## 2013-08-15 LAB — LIPID PANEL
Cholesterol: 251 mg/dL — ABNORMAL HIGH (ref 0–200)
HDL: 49 mg/dL (ref >39)
LDL Cholesterol: 170 mg/dL — ABNORMAL HIGH (ref 0–99)
TRIGLYCERIDES: 162 mg/dL — AB (ref <150)
Total CHOL/HDL Ratio: 5.1 Ratio
VLDL: 32 mg/dL (ref 0–40)

## 2013-08-15 LAB — BASIC METABOLIC PANEL
BUN: 11 mg/dL (ref 6–23)
CALCIUM: 9.5 mg/dL (ref 8.4–10.5)
CO2: 28 mEq/L (ref 19–32)
CREATININE: 0.63 mg/dL (ref 0.50–1.10)
Chloride: 101 mEq/L (ref 96–112)
Glucose, Bld: 87 mg/dL (ref 70–99)
Potassium: 3.9 mEq/L (ref 3.5–5.3)
Sodium: 139 mEq/L (ref 135–145)

## 2013-08-15 LAB — CBC WITH DIFFERENTIAL/PLATELET
BASOS ABS: 0 10*3/uL (ref 0.0–0.1)
Basophils Relative: 0 % (ref 0–1)
EOS ABS: 0.2 10*3/uL (ref 0.0–0.7)
EOS PCT: 3 % (ref 0–5)
HCT: 40.4 % (ref 36.0–46.0)
Hemoglobin: 13.8 g/dL (ref 12.0–15.0)
Lymphocytes Relative: 36 % (ref 12–46)
Lymphs Abs: 2.5 10*3/uL (ref 0.7–4.0)
MCH: 30.2 pg (ref 26.0–34.0)
MCHC: 34.2 g/dL (ref 30.0–36.0)
MCV: 88.4 fL (ref 78.0–100.0)
Monocytes Absolute: 0.6 10*3/uL (ref 0.1–1.0)
Monocytes Relative: 9 % (ref 3–12)
Neutro Abs: 3.8 10*3/uL (ref 1.7–7.7)
Neutrophils Relative %: 52 % (ref 43–77)
PLATELETS: 265 10*3/uL (ref 150–400)
RBC: 4.57 MIL/uL (ref 3.87–5.11)
RDW: 14 % (ref 11.5–15.5)
WBC: 7.1 10*3/uL (ref 4.0–10.5)

## 2013-08-15 LAB — HEPATIC FUNCTION PANEL
ALT: 23 U/L (ref 0–35)
AST: 28 U/L (ref 0–37)
Albumin: 4.5 g/dL (ref 3.5–5.2)
Alkaline Phosphatase: 112 U/L (ref 39–117)
BILIRUBIN DIRECT: 0.1 mg/dL (ref 0.0–0.3)
BILIRUBIN INDIRECT: 0.5 mg/dL (ref 0.0–0.9)
TOTAL PROTEIN: 7.8 g/dL (ref 6.0–8.3)
Total Bilirubin: 0.6 mg/dL (ref 0.3–1.2)

## 2013-08-15 LAB — HEMOGLOBIN A1C
HEMOGLOBIN A1C: 6.2 % — AB (ref <5.7)
MEAN PLASMA GLUCOSE: 131 mg/dL — AB (ref <117)

## 2013-08-15 LAB — T4, FREE: FREE T4: 0.9 ng/dL (ref 0.80–1.80)

## 2013-08-15 LAB — TSH: TSH: 1.874 u[IU]/mL (ref 0.350–4.500)

## 2013-08-15 MED ORDER — METFORMIN HCL 500 MG PO TABS: 500.0000 mg | ORAL_TABLET | Freq: Two times a day (BID) | ORAL | Status: DC

## 2013-08-15 MED ORDER — LISINOPRIL-HYDROCHLOROTHIAZIDE 20-25 MG PO TABS: 1.0000 | ORAL_TABLET | Freq: Every day | ORAL | Status: DC

## 2013-08-15 NOTE — Patient Instructions (Signed)
Please obtain labs. I will call you with your results.  I have refilled your metformin.  Stop the losartan and restart new BP medication.  Follow-up in 1 month.  I will place a referral to GI and Pulmonology.  We will schedule additional follow-up if indicated by lab results.

## 2013-08-15 NOTE — Progress Notes (Signed)
Patient presents to clinic today to establish care.  Acute Concerns: Patient is concerned about her medical diagnosis.  Has alpha-1 antitrypsin deficiency w/ strong family history of the disorder.  Sisters have very advanced COPD. Patient currently asymptomatic.  Has scheduled PFTs and appointment scheduled with Pulmonology.  Would like a new referral so that she can switch to Dr. Delford FieldWright who comes to our office each week. Patient is aware that [facility].  Chronic Issues: Hypertension -- Patient currently on Lisinopril-HCTZ daily.  Endorses good control of her symptoms.  Denies headache, vision changes, chest pain, palpitations or shortness of breath.  BP 130/72 in clinic.  Hyperlipidemia -- currently on Lipitor.  Denies myalgias.  Diabetes -- well-controlled with Metformin.  Is due for repeat A1c.  Denies vision changes, numbness or tingling of extremities.  Is on ACEI therapy.  Crohn's Disease -- patient needs referral to GI.  Denies flare-up.  Anxiety/Depression -- denies suicidal thought or ideation.  Symptoms controlled with Klonopin, Cymbalta, Lamictal and Trazodone. Patient followed by Psychiatry.  Health Maintenance: Dental -- Overdue Vision -- UTD Immunizations -- Has had flu shot; Asking about pneumonia shot.  Colonoscopy -- Has Crohn's disease; Last colonoscopy in 2009; Referral to GI -- [facility] -- Last in 2013.  Referral to Mammogram.  Past Medical History  Diagnosis Date  . Hypertension   . Diabetes mellitus   . Hyperlipidemia   . Allergic rhinitis   . OSA (obstructive sleep apnea)   . Alpha-1-antitrypsin deficiency     type ZZ  . Crohn disease   . Depression   . GERD (gastroesophageal reflux disease)   . Migraines     Past Surgical History  Procedure Laterality Date  . Total abdominal hysterectomy    . Achilles tendon repair  2009    x2  . Tonsillectomy    . Colonoscopy    . Achilles tendon surgery  01/04/2012     Procedure: ACHILLES TENDON REPAIR;  Surgeon: Loreta Aveaniel F Murphy, MD;  Location:  SUR[facility]ERY C[facility];  Service: Orthopedics;  Laterality: Left;  repair rupture achilles tendon primary open, excision bone cyst/benign tumor talus/calcaneous    Current Outpatient Prescriptions on File Prior to Visit  Medication Sig Dispense Refill  . clonazePAM (KLONOPIN) 0.5 MG tablet Take 0.5 mg by mouth 2 (two) times daily as needed.      . DULoxetine (CYMBALTA) 60 MG capsule Take 60 mg by mouth daily.        Marland Kitchen. lamoTRIgine (LAMICTAL) 200 MG tablet Take 200 mg by mouth daily.        . metFORMIN (GLUCOPHAGE) 500 MG tablet Take 500 mg by mouth 2 (two) times daily.       . traZODone (DESYREL) 50 MG tablet Take 50 mg by mouth at bedtime.         No current facility-administered medications on file prior to visit.    Allergies  Allergen Reactions  . Codeine     nausea    Family History  Problem Relation Age of Onset  . Alpha-1 antitrypsin deficiency Other     all siblings have ZZ type  . Heart disease Paternal Grandfather   . Heart disease Father   . Stroke Mother   . Alzheimer's disease Mother   . Alcohol abuse Brother     History   Social History  . Marital Status: Single    Spouse Name: N/A    Number of Children: N  .  Years of Education: N/A   Occupational History  . psychotherapist    Social History Main Topics  . Smoking status: Former Smoker -- 7.00 packs/day for 1 years    Types: Cigarettes    Quit date: 07/24/1980  . Smokeless tobacco: Never Used  . Alcohol Use: No  . Drug Use: No  . Sexual Activity: Not on file   Other Topics Concern  . Not on file   Social History Narrative  . No narrative on file    Review of Systems  Constitutional: Negative for fever and weight loss.  HENT: Negative for ear discharge, ear pain, hearing loss and tinnitus.   Eyes: Negative for blurred vision, double vision, photophobia and pain.  Respiratory: Negative for cough, shortness of  breath and wheezing.   Cardiovascular: Negative for chest pain and palpitations.  Gastrointestinal: Positive for heartburn. Negative for nausea, vomiting, abdominal pain, diarrhea, constipation, blood in stool and melena.  Genitourinary: Negative.   Neurological: Negative for dizziness, seizures, loss of consciousness and headaches.  Endo/Heme/Allergies: Negative for environmental allergies.  Psychiatric/Behavioral: Positive for depression. Negative for suicidal ideas, hallucinations, memory loss and substance abuse. The patient is nervous/anxious and has insomnia.     BP 130/72  Pulse 76  Temp(Src) 98.6 F (37 C) (Oral)  Ht 5' 4.75" (1.645 m)  Wt 241 lb 12.8 oz (109.68 kg)  BMI 40.53 kg/m2  SpO2 95%  Physical Exam  Vitals reviewed. Constitutional: She is oriented to person, place, and time and well-developed, well-nourished, and in no distress.  HENT:  Head: Normocephalic and atraumatic.  Right Ear: External ear normal.  Left Ear: External ear normal.  Nose: Nose normal.  Mouth/Throat: Oropharynx is clear and moist. No oropharyngeal exudate.  TM within normal limits bilaterally.  Eyes: Conjunctivae and EOM are normal. Pupils are equal, round, and reactive to light.  Neck: Neck supple. No thyromegaly present.  Cardiovascular: Normal rate, regular rhythm, normal heart sounds and intact distal pulses.   No murmur heard. Pulmonary/Chest: Effort normal and breath sounds normal. No respiratory distress. She has no wheezes. She has no rales. She exhibits no tenderness.  Abdominal: Soft. Bowel sounds are normal. She exhibits no distension and no mass. There is no tenderness. There is no rebound and no guarding.  Lymphadenopathy:    She has no cervical adenopathy.  Neurological: She is alert and oriented to person, place, and time. No cranial nerve deficit.  Skin: Skin is warm and dry. No rash noted.  Psychiatric: Affect normal.    No results found for this or any previous visit  (from the past 2160 hour(s)).  Assessment/Plan: No problem-specific assessment & plan notes found for this encounter.

## 2013-08-16 LAB — URINALYSIS, ROUTINE W REFLEX MICROSCOPIC
Bilirubin Urine: NEGATIVE
GLUCOSE, UA: NEGATIVE mg/dL
HGB URINE DIPSTICK: NEGATIVE
Ketones, ur: NEGATIVE mg/dL
LEUKOCYTES UA: NEGATIVE
Nitrite: NEGATIVE
PH: 6 (ref 5.0–8.0)
Protein, ur: NEGATIVE mg/dL
SPECIFIC GRAVITY, URINE: 1.014 (ref 1.005–1.030)
Urobilinogen, UA: 0.2 mg/dL (ref 0.0–1.0)

## 2013-08-17 ENCOUNTER — Telehealth: Payer: Self-pay | Admitting: Physician Assistant

## 2013-08-17 DIAGNOSIS — E785 Hyperlipidemia, unspecified: Secondary | ICD-10-CM

## 2013-08-17 DIAGNOSIS — I10 Essential (primary) hypertension: Secondary | ICD-10-CM

## 2013-08-17 DIAGNOSIS — E119 Type 2 diabetes mellitus without complications: Secondary | ICD-10-CM

## 2013-08-17 MED ORDER — ATORVASTATIN CALCIUM 20 MG PO TABS: 20.0000 mg | ORAL_TABLET | Freq: Every day | ORAL | Status: DC

## 2013-08-17 NOTE — Telephone Encounter (Signed)
Labs look good overall. It seems the metformin is working well for her diabetes.  No need for additional medication at this time.  She should be taking the metformin BID.  Her thyroid function (TSH and T4) were normal.  Her cholesterol was high -- LDL at 170.  Her LDL goal is 100.  We need to start a medication for cholesterol.  I will send in an RX for Lipitor 20 mg.  I would like for her to take 1/2 tablet x 1 week, then increase to 1 tablet daily.  Follow-up in 2 months for repeat lipid profile and hepatic function panel.  Most common side effect is myalgias.  Patient should call if she is experiencing these symptoms.

## 2013-08-18 ENCOUNTER — Telehealth: Payer: Self-pay | Admitting: Physician Assistant

## 2013-08-18 NOTE — Telephone Encounter (Signed)
LMOM with contact name and number for return call RE: results and further provider instructions/SLS  

## 2013-08-18 NOTE — Telephone Encounter (Signed)
Relevant patient education assigned to patient using Emmi. ° °

## 2013-08-19 ENCOUNTER — Telehealth: Payer: Self-pay

## 2013-08-19 NOTE — Telephone Encounter (Signed)
Relevant patient education assigned to patient using Emmi. ° °

## 2013-08-20 DIAGNOSIS — K5090 Crohn's disease, unspecified, without complications: Secondary | ICD-10-CM | POA: Insufficient documentation

## 2013-08-20 DIAGNOSIS — I10 Essential (primary) hypertension: Secondary | ICD-10-CM | POA: Insufficient documentation

## 2013-08-20 DIAGNOSIS — Z0000 Encounter for general adult medical examination without abnormal findings: Secondary | ICD-10-CM | POA: Insufficient documentation

## 2013-08-20 DIAGNOSIS — E785 Hyperlipidemia, unspecified: Secondary | ICD-10-CM | POA: Insufficient documentation

## 2013-08-20 DIAGNOSIS — E119 Type 2 diabetes mellitus without complications: Secondary | ICD-10-CM | POA: Insufficient documentation

## 2013-08-20 MED ORDER — LISINOPRIL-HYDROCHLOROTHIAZIDE 20-25 MG PO TABS: 1.0000 | ORAL_TABLET | Freq: Every day | ORAL | Status: DC

## 2013-08-20 MED ORDER — METFORMIN HCL 500 MG PO TABS: 500.0000 mg | ORAL_TABLET | Freq: Two times a day (BID) | ORAL | Status: DC

## 2013-08-20 NOTE — Assessment & Plan Note (Signed)
Will obtain A1C and renal panel. Continue medication as prescribed.

## 2013-08-20 NOTE — Assessment & Plan Note (Addendum)
History reviewed.  Health Maintenance mostly UTD.  Will place referral to GI -- [facility] for colonoscopy. Pneumonia vaccination given.

## 2013-08-20 NOTE — Assessment & Plan Note (Signed)
Will switch from ARB combo therapy to Lisinopril-HCTZ due to cost.  Recheck BP in 2 weeks.

## 2013-08-20 NOTE — Telephone Encounter (Signed)
Patient returned phone call. Best # [phone]

## 2013-08-20 NOTE — Assessment & Plan Note (Signed)
Followed by Pulmonology.  Would like to transfer to Dr. Delford FieldWright.

## 2013-08-20 NOTE — Telephone Encounter (Signed)
Patient informed, understood & agreed medications to mail order for 90-day supply, will call back to schedule f/u OV/SLS

## 2013-08-20 NOTE — Assessment & Plan Note (Signed)
Asymptomatic at present.  Referal to GI -- [facility] -- for colonoscopy and maintenance.

## 2013-08-20 NOTE — Assessment & Plan Note (Signed)
Will obtain fasting lipid panel. Continue current medications for now.

## 2013-08-22 ENCOUNTER — Other Ambulatory Visit: Payer: Self-pay

## 2013-08-22 DIAGNOSIS — Z1231 Encounter for screening mammogram for malignant neoplasm of breast: Secondary | ICD-10-CM

## 2013-09-03 ENCOUNTER — Other Ambulatory Visit: Payer: Self-pay | Admitting: *Deleted

## 2013-09-03 DIAGNOSIS — E119 Type 2 diabetes mellitus without complications: Secondary | ICD-10-CM

## 2013-09-05 ENCOUNTER — Telehealth: Payer: Self-pay | Admitting: Physician Assistant

## 2013-09-05 DIAGNOSIS — E785 Hyperlipidemia, unspecified: Secondary | ICD-10-CM

## 2013-09-05 DIAGNOSIS — I10 Essential (primary) hypertension: Secondary | ICD-10-CM

## 2013-09-05 DIAGNOSIS — E119 Type 2 diabetes mellitus without complications: Secondary | ICD-10-CM

## 2013-09-05 MED ORDER — LISINOPRIL-HYDROCHLOROTHIAZIDE 20-25 MG PO TABS: 1.0000 | ORAL_TABLET | Freq: Every day | ORAL | Status: DC

## 2013-09-05 MED ORDER — METFORMIN HCL 500 MG PO TABS: 500.0000 mg | ORAL_TABLET | Freq: Two times a day (BID) | ORAL | Status: DC

## 2013-09-05 MED ORDER — ATORVASTATIN CALCIUM 20 MG PO TABS: 20.0000 mg | ORAL_TABLET | Freq: Every day | ORAL | Status: DC

## 2013-09-05 NOTE — Telephone Encounter (Signed)
New rx request  lipitor   Metformin  lisino/hctz

## 2013-09-16 ENCOUNTER — Ambulatory Visit: Payer: BC Managed Care – PPO | Admitting: Pulmonary Disease

## 2013-09-19 ENCOUNTER — Ambulatory Visit: Payer: PRIVATE HEALTH INSURANCE

## 2013-09-26 ENCOUNTER — Other Ambulatory Visit: Payer: Self-pay | Admitting: Physician Assistant

## 2013-09-26 ENCOUNTER — Encounter: Payer: Self-pay | Admitting: Physician Assistant

## 2013-09-26 DIAGNOSIS — Z1231 Encounter for screening mammogram for malignant neoplasm of breast: Secondary | ICD-10-CM

## 2013-09-30 ENCOUNTER — Ambulatory Visit (HOSPITAL_COMMUNITY)
Admission: RE | Admit: 2013-09-30 | Discharge: 2013-09-30 | Disposition: A | Discharge status: Home / Self Care | Payer: Self-pay | Source: Ambulatory Visit | Attending: Physician Assistant | Admitting: Physician Assistant

## 2013-09-30 DIAGNOSIS — Z1231 Encounter for screening mammogram for malignant neoplasm of breast: Secondary | ICD-10-CM

## 2014-01-12 ENCOUNTER — Telehealth: Payer: Self-pay | Admitting: *Deleted

## 2014-01-12 DIAGNOSIS — E119 Type 2 diabetes mellitus without complications: Secondary | ICD-10-CM

## 2014-01-12 DIAGNOSIS — I10 Essential (primary) hypertension: Secondary | ICD-10-CM

## 2014-01-12 MED ORDER — METFORMIN HCL 500 MG PO TABS: 500.0000 mg | ORAL_TABLET | Freq: Two times a day (BID) | ORAL | Status: DC

## 2014-01-12 MED ORDER — ATORVASTATIN CALCIUM 20 MG PO TABS: 20.0000 mg | ORAL_TABLET | Freq: Every day | ORAL | Status: AC

## 2014-01-12 MED ORDER — LISINOPRIL-HYDROCHLOROTHIAZIDE 20-25 MG PO TABS: 1.0000 | ORAL_TABLET | Freq: Every day | ORAL | Status: DC

## 2014-01-12 NOTE — Telephone Encounter (Signed)
Patient states that she is now homeless, living out of her car for the last  [3] months & has had no Insurance for the last [5] months & request to have Metformin, Lisinopril & Atorvastatin sent to [facility] on [address] [phone]]; sent 30-day supply of each as PATIENT DUE FOR FOLLOW-UP OFFICE VISIT/SLS

## 2014-02-20 ENCOUNTER — Telehealth: Payer: Self-pay | Admitting: *Deleted

## 2014-02-20 DIAGNOSIS — I10 Essential (primary) hypertension: Secondary | ICD-10-CM

## 2014-02-20 DIAGNOSIS — E119 Type 2 diabetes mellitus without complications: Secondary | ICD-10-CM

## 2014-02-20 MED ORDER — METFORMIN HCL 500 MG PO TABS
500.0000 mg | ORAL_TABLET | Freq: Two times a day (BID) | ORAL | Status: AC
Start: 2014-02-20 — End: ?

## 2014-02-20 MED ORDER — LISINOPRIL-HYDROCHLOROTHIAZIDE 20-25 MG PO TABS: 1.0000 | ORAL_TABLET | Freq: Every day | ORAL | Status: AC

## 2014-02-20 NOTE — Telephone Encounter (Signed)
Rx request to pharmacy, 30-day only/SLS **PATIENT DUE FOR FOLLOW-UP OFFICE VISIT**   

## 2014-03-12 ENCOUNTER — Encounter (HOSPITAL_COMMUNITY): Payer: Self-pay | Admitting: Emergency Medicine

## 2014-03-12 ENCOUNTER — Emergency Department (INDEPENDENT_AMBULATORY_CARE_PROVIDER_SITE_OTHER)
Admission: EM | Admit: 2014-03-12 | Discharge: 2014-03-12 | Disposition: A | Discharge status: Home / Self Care | Payer: Self-pay | Source: Home / Self Care | Attending: Emergency Medicine | Admitting: Emergency Medicine

## 2014-03-12 DIAGNOSIS — L84 Corns and callosities: Secondary | ICD-10-CM

## 2014-03-12 DIAGNOSIS — E1165 Type 2 diabetes mellitus with hyperglycemia: Secondary | ICD-10-CM

## 2014-03-12 DIAGNOSIS — IMO0001 Reserved for inherently not codable concepts without codable children: Secondary | ICD-10-CM

## 2014-03-12 DIAGNOSIS — IMO0002 Reserved for concepts with insufficient information to code with codable children: Secondary | ICD-10-CM

## 2014-03-12 NOTE — ED Notes (Signed)
Pt  Is  A  Diabetic  She  Has   Swelling  Of   Both  Feet          For  sev  Months   -  Ambulated  To  Room  With a  Steady  Fluid  Gait

## 2014-03-12 NOTE — Discharge Instructions (Signed)
Corns and Calluses A thickening of the skin layer (usually over bony areas, such as toe joints) is known as a corn. Two types of corns exist: hard corns and soft corns. Calluses are painless areas of skin thickening that are caused by repeated pressure or irritation. Corns tend to affect toe joints and the skin between the toes; whereas, a callus can appear on any part of the body (especially the hands, feet, or knees).  SYMPTOMS   Corn:  Presence of a small (1/8 to 3/8 inch [3 to 10 mm in diameter]), painful bump on the side or over the joint of a toe.  Hard corns are more common on the outer portion of the little (fifth) toe at the joint.  Soft corns are more common between bony bumps (prominences), usually between the fourth and fifth toes or between the second and third toes.  Callus:  A rough, thickened area of skin that appears after repeated pressure or irritation. CAUSES  The purpose of corns and calluses is to protect an area of skin from injury caused by repeated irritation (rubbing or squeezing). The presence of pressure causes the skin cells to grow at a faster rate than the cells of unaffected areas. This leads to an overgrowth (corn or callus). As apposed to hard corns, soft corns tend to develop between toes, because there is more moisture. Soft corns are often the result of prolonged shoe wear, which leads to increased perspiration and moisture.  RISK INCREASES WITH:  Shoes that are too tight.  Occupations or sports that involve repetitive pressure on the hands (racquetball and baseball) or sudden stops on hard surfaces (track and tennis).  Sports that require the athlete to wear shoes, perspire, or wear clothing or protective gear that causes the production of heat and friction. PREVENTION  Properly fitted shoes and equipment.  Modify activities to prevent constant pressure on specific areas of skin.  If possible, wear padding over areas of skin that are exposed to  repeated pressure or irritation.  Keep the area between the toes dry (with powder or by removing shoes often).  Relieve shoe pressure by stretching the areas of the shoe that cause the pressure and or use ointments to soften leather shoes. PROGNOSIS  Corns and calluses typically subside if the activity that causes them is eliminated. Recovery may take up to 3 weeks. Recurrence is likely even with treatment if the cause is not removed.  RELATED COMPLICATIONS  If one overcompensates in an attempt to avoid pain, he or she may experience pain in other areas due to the changes in body movements (mechanics). TREATMENT  The best way to treat corns and calluses is to remove the source of pressure. Corn and callus pads may be helpful in reducing pressure on the affected skin. For soft corns, try to keep the affected area dry. If you cannot find shoes that fit properly, a shoe repair shop may be able to alter your shoes to reduce pressure. Occasionally a cushion for the bottom of the foot (metatarsal bar) worn within the shoe may relieve pressure on corns or calluses of the foot. For calluses, you may be able to peel or rub the thickened area with a pumice stone, sandstone, callus file, or with sandpaper to remove the callus; wetting the affected area may make this process more effective. Do not cut the corn or callus with a razor or knife. If the corn or callus must be removed, then a medically trained person should perform   the procedure. After peeling away the upper layers of a corn once or twice a day, it may be recommended to apply a non-prescription 5% to 10% salicylic ointment and cover the area with a bandage. It very uncommon to have the bony bumps (at toe joints) surgically removed. MEDICATION   If pain medication is necessary, nonsteroidal anti-inflammatory medications, such as aspirin and ibuprofen, or other minor pain relievers, such as acetaminophen, are often recommended. Contact your caregiver  immediately if any bleeding, stomach upset, or signs of an allergic reaction occur.  Topical salicylic ointments (5% to 10%) may be of benefit.  Prescription pain medications may be given by a caregiver. Use only as directed and only as much as you need.  Soak the foot for 20 minutes, twice a day, in a gallon of warm water. This may help to soften corns and calluses. Care should be taken to thoroughly dry the foot, especially between the toes, after soaking. SEEK MEDICAL CARE IF:   Symptoms get worse or do not improve in 2 weeks despite treatment.  Any signs of infection develop, including redness, swelling, increased pain or tenderness, or increased warmth around the corn or callus.  New, unexplained symptoms develop (drugs used in treatment may produce side effects). Document Released: 07/10/2005 Document Revised: 10/02/2011 Document Reviewed: 10/22/2008 Exit[employer] Patient Information 2015 [employer]. This information is not intended to replace advice given to you by your health care provider. Make sure you discuss any questions you have with your health care provider.  

## 2014-03-12 NOTE — ED Provider Notes (Signed)
CSN: [id number]     Arrival date & time 03/12/14  1427 History   First MD Initiated Contact with Patient 03/12/14 1604     Chief Complaint  Patient presents with  . Foot Problem   (Consider location/radiation/quality/duration/timing/severity/associated sxs/prior Treatment) HPI Comments: 63 year old female with history of type 2 diabetes mellitus, alpha-1 antitrypsin deficiency, Crohn's disease, presents complaining of possible infection on her right toes. She says that for 3 years she does have thickened skin on the end of one of her toes, and for the past 3 or 4 months she has had thickened skin on another toe. The thickened skin has started to look black to her so she wanted to come in to be seen. She has a mild subjective numbness in her toes at the area of the thickened skin as well. She reports a long history of thickened toenails as well that is sometimes painful. Also she reports swelling in her legs for many months, equal bilaterally. She has not been able to get in to see her primary care doctor because she is currently homeless, unemployed, without health insurance. She was offered a job today and will be offered insurance through her job so she will be able to get in to see them again. Previously she has not had any issues with the alpha-1 antitrypsin deficiency and her hemoglobin A1c is only 6.3 on metformin 500 twice a day. He has not had any Crohn's flares in a long time, although she is 4 years past due for her colonoscopy   Past Medical History  Diagnosis Date  . Hypertension   . Diabetes mellitus   . Hyperlipidemia   . Allergic rhinitis   . OSA (obstructive sleep apnea)   . Alpha-1-antitrypsin deficiency     type ZZ  . Crohn disease   . Depression   . GERD (gastroesophageal reflux disease)   . Migraines    Past Surgical History  Procedure Laterality Date  . Total abdominal hysterectomy    . Achilles tendon repair  2009    x2  . Tonsillectomy    . Colonoscopy    .  Achilles tendon surgery  01/04/2012    Procedure: ACHILLES TENDON REPAIR;  Surgeon: Loreta Ave, MD;  Location: [facility];  Service: Orthopedics;  Laterality: Left;  repair rupture achilles tendon primary open, excision bone cyst/benign tumor talus/calcaneous   Family History  Problem Relation Age of Onset  . Alpha-1 antitrypsin deficiency Other     all siblings have ZZ type  . Heart disease Paternal Grandfather   . Heart disease Father   . Stroke Mother   . Alzheimer's disease Mother   . Alcohol abuse Brother    History  Substance Use Topics  . Smoking status: Former Smoker -- 7.00 packs/day for 1 years    Types: Cigarettes    Quit date: 07/24/1980  . Smokeless tobacco: Never Used  . Alcohol Use: No   OB History   Grav Para Term Preterm Abortions TAB SAB Ect Mult Living                 Review of Systems  Constitutional: Negative for fever and chills.  Eyes: Negative for visual disturbance.  Respiratory: Negative for cough and shortness of breath.   Cardiovascular: Positive for leg swelling. Negative for chest pain and palpitations.  Gastrointestinal: Negative for nausea, vomiting, abdominal pain and diarrhea.  Endocrine: Negative for polydipsia and polyuria.  Genitourinary: Negative for dysuria, urgency and frequency.  Musculoskeletal:  See history of present illness regarding toes of the right foot  Skin: Negative for rash.  Neurological: Positive for numbness. Negative for dizziness, weakness and light-headedness.  All other systems reviewed and are negative.   Allergies  Codeine  Home Medications   Prior to Admission medications   Medication Sig Start Date End Date Taking? Authorizing Provider  atorvastatin (LIPITOR) 20 MG tablet Take 1 tablet (20 mg total) by mouth daily. 01/12/14   Waldon MerlWilliam C Martin, PA-C  clonazePAM (KLONOPIN) 0.5 MG tablet Take 0.5 mg by mouth 2 (two) times daily as needed.    Historical Provider, MD  DULoxetine  (CYMBALTA) 60 MG capsule Take 60 mg by mouth daily.      Historical Provider, MD  lamoTRIgine (LAMICTAL) 200 MG tablet Take 200 mg by mouth daily.      Historical Provider, MD  lisinopril-hydrochlorothiazide (PRINZIDE,ZESTORETIC) 20-25 MG per tablet Take 1 tablet by mouth daily. 02/20/14   Waldon MerlWilliam C Martin, PA-C  metFORMIN (GLUCOPHAGE) 500 MG tablet Take 1 tablet (500 mg total) by mouth 2 (two) times daily. 02/20/14   Waldon MerlWilliam C Martin, PA-C  traZODone (DESYREL) 50 MG tablet Take 50 mg by mouth at bedtime.      Historical Provider, MD   BP 117/78  Pulse 90  Temp(Src) 99 F (37.2 C) (Oral)  Resp 18  SpO2 96% Physical Exam  Nursing note and vitals reviewed. Constitutional: She is oriented to person, place, and time. Vital signs are normal. She appears well-developed and well-nourished. No distress.  HENT:  Head: Normocephalic and atraumatic.  Neck: Normal range of motion. Neck supple.  Cardiovascular: Normal rate, regular rhythm and normal heart sounds.   1+ pitting edema of bilateral lower extremities up to the midcalf  Pulmonary/Chest: Effort normal and breath sounds normal. No respiratory distress. She has no wheezes. She has no rales.  Musculoskeletal:  The skin of the distal portions of the second and third toes on the right foot is callused, with capillary refill less than 3 seconds. No signs of infection. The toenails on the right foot are hypertrophic, consistent with onychomycosis of the third and fourth toenails  Neurological: She is alert and oriented to person, place, and time. She has normal strength. Coordination normal.  Skin: Skin is warm and dry. No rash noted. She is not diaphoretic.  Psychiatric: She has a normal mood and affect. Judgment normal.    ED Course  Procedures (including critical care time) Labs Review Labs Reviewed - No data to display  Imaging Review No results found.   MDM   1. Pre-ulcerative corn or callous   2. Diabetes mellitus type II,  uncontrolled    She has onychomycosis, given her alpha-1 antitrypsin deficiency and propensity for liver disease, I would like her to get this scraped and tested by podiatry prior to initiating antifungal treatment. Podiatry also needs to see her for regular diabetic foot care, but she does not have evidence of any sort of infection at this time. She needs workup by primary care for the chronic leg swelling, and the differential for this is too broad to even begin here.    Graylon GoodZachary H Jessyka Austria, PA-C 03/12/14 1733

## 2014-03-12 NOTE — ED Provider Notes (Signed)
Medical screening examination/treatment/procedure(s) were performed by non-physician practitioner and as supervising physician I was immediately available for consultation/collaboration.  Leslee Home, M.D.  Reuben Likes, MD 03/12/14 2252

## 2014-03-23 ENCOUNTER — Telehealth: Payer: Self-pay | Admitting: Physician Assistant

## 2014-03-23 NOTE — Telephone Encounter (Signed)
Left patient a vm to call back to schedule CPE.  Last CPE was 08/15/2013.

## 2014-04-13 ENCOUNTER — Encounter (HOSPITAL_COMMUNITY): Payer: Self-pay | Admitting: Emergency Medicine

## 2014-04-13 ENCOUNTER — Emergency Department (INDEPENDENT_AMBULATORY_CARE_PROVIDER_SITE_OTHER)
Admission: EM | Admit: 2014-04-13 | Discharge: 2014-04-13 | Disposition: A | Discharge status: Home / Self Care | Payer: Self-pay | Source: Home / Self Care | Attending: Family Medicine | Admitting: Family Medicine

## 2014-04-13 DIAGNOSIS — E119 Type 2 diabetes mellitus without complications: Secondary | ICD-10-CM

## 2014-04-13 DIAGNOSIS — I10 Essential (primary) hypertension: Secondary | ICD-10-CM

## 2014-04-13 LAB — POCT I-STAT, CHEM 8
BUN: 13 mg/dL (ref 6–23)
CALCIUM ION: 1.14 mmol/L (ref 1.13–1.30)
Chloride: 101 mEq/L (ref 96–112)
Creatinine, Ser: 0.6 mg/dL (ref 0.50–1.10)
Glucose, Bld: 203 mg/dL — ABNORMAL HIGH (ref 70–99)
HCT: 48 % — ABNORMAL HIGH (ref 36.0–46.0)
Hemoglobin: 16.3 g/dL — ABNORMAL HIGH (ref 12.0–15.0)
Potassium: 3.3 mEq/L — ABNORMAL LOW (ref 3.7–5.3)
SODIUM: 139 meq/L (ref 137–147)
TCO2: 28 mmol/L (ref 0–100)

## 2014-04-13 MED ORDER — METFORMIN HCL 500 MG PO TABS: 500.0000 mg | ORAL_TABLET | Freq: Two times a day (BID) | ORAL | Status: AC

## 2014-04-13 MED ORDER — LISINOPRIL-HYDROCHLOROTHIAZIDE 20-25 MG PO TABS: 1.0000 | ORAL_TABLET | Freq: Every day | ORAL | Status: AC

## 2014-04-13 NOTE — Discharge Instructions (Signed)
Take medicine as prescribed, see your doctor for further refills.

## 2014-04-13 NOTE — ED Provider Notes (Signed)
CSN: [id number]     Arrival date & time 04/13/14  1651 History   First MD Initiated Contact with Patient 04/13/14 1817     Chief Complaint  Patient presents with  . Hypertension   (Consider location/radiation/quality/duration/timing/severity/associated sxs/prior Treatment) Patient is a 63 y.o. female presenting with hypertension.  Hypertension This is a chronic problem. The current episode started more than 1 week ago (out of bp meds for approx 1 mo., homeless without job.). The problem has been gradually worsening. Associated symptoms include headaches. Pertinent negatives include no chest pain.    Past Medical History  Diagnosis Date  . Hypertension   . Diabetes mellitus   . Hyperlipidemia   . Allergic rhinitis   . OSA (obstructive sleep apnea)   . Alpha-1-antitrypsin deficiency     type ZZ  . Crohn disease   . Depression   . GERD (gastroesophageal reflux disease)   . Migraines    Past Surgical History  Procedure Laterality Date  . Total abdominal hysterectomy    . Achilles tendon repair  2009    x2  . Tonsillectomy    . Colonoscopy    . Achilles tendon surgery  01/04/2012    Procedure: ACHILLES TENDON REPAIR;  Surgeon: Loreta Ave, MD;  Location: [facility];  Service: Orthopedics;  Laterality: Left;  repair rupture achilles tendon primary open, excision bone cyst/benign tumor talus/calcaneous   Family History  Problem Relation Age of Onset  . Alpha-1 antitrypsin deficiency Other     all siblings have ZZ type  . Heart disease Paternal Grandfather   . Heart disease Father   . Stroke Mother   . Alzheimer's disease Mother   . Alcohol abuse Brother    History  Substance Use Topics  . Smoking status: Former Smoker -- 7.00 packs/day for 1 years    Types: Cigarettes    Quit date: 07/24/1980  . Smokeless tobacco: Never Used  . Alcohol Use: No   OB History   Grav Para Term Preterm Abortions TAB SAB Ect Mult Living                 Review of  Systems  Constitutional: Negative.   Respiratory: Negative.   Cardiovascular: Positive for leg swelling. Negative for chest pain and palpitations.  Neurological: Positive for dizziness and headaches.    Allergies  Codeine  Home Medications   Prior to Admission medications   Medication Sig Start Date End Date Taking? Authorizing Provider  atorvastatin (LIPITOR) 20 MG tablet Take 1 tablet (20 mg total) by mouth daily. 01/12/14   Waldon Merl, PA-C  clonazePAM (KLONOPIN) 0.5 MG tablet Take 0.5 mg by mouth 2 (two) times daily as needed.    Historical Provider, MD  DULoxetine (CYMBALTA) 60 MG capsule Take 60 mg by mouth daily.      Historical Provider, MD  lamoTRIgine (LAMICTAL) 200 MG tablet Take 200 mg by mouth daily.      Historical Provider, MD  lisinopril-hydrochlorothiazide (PRINZIDE,ZESTORETIC) 20-25 MG per tablet Take 1 tablet by mouth daily. 02/20/14   Waldon Merl, PA-C  lisinopril-hydrochlorothiazide (PRINZIDE,ZESTORETIC) 20-25 MG per tablet Take 1 tablet by mouth daily. 04/13/14   Linna Hoff, MD  metFORMIN (GLUCOPHAGE) 500 MG tablet Take 1 tablet (500 mg total) by mouth 2 (two) times daily. 02/20/14   Waldon Merl, PA-C  metFORMIN (GLUCOPHAGE) 500 MG tablet Take 1 tablet (500 mg total) by mouth 2 (two) times daily with a meal. 04/13/14   Fayrene Fearing  Sallyanne Kuster, MD  traZODone (DESYREL) 50 MG tablet Take 50 mg by mouth at bedtime.      Historical Provider, MD   BP 177/94  Pulse 86  Temp(Src) 97.9 F (36.6 C) (Oral)  Resp 18  SpO2 100% Physical Exam  Nursing note and vitals reviewed. Constitutional: She is oriented to person, place, and time. She appears well-developed and well-nourished. No distress.  Eyes: Pupils are equal, round, and reactive to light.  Neck: Normal range of motion. Neck supple.  Cardiovascular: Normal rate, regular rhythm and normal heart sounds.   Pulmonary/Chest: Effort normal and breath sounds normal.  Musculoskeletal: She exhibits no edema.   Neurological: She is alert and oriented to person, place, and time.  Skin: Skin is warm and dry.    ED Course  Procedures (including critical care time) Labs Review Labs Reviewed  POCT I-STAT, CHEM 8 - Abnormal; Notable for the following:    Potassium 3.3 (*)    Glucose, Bld 203 (*)    Hemoglobin 16.3 (*)    HCT 48.0 (*)    All other components within normal limits   i-stat reviewed/ Imaging Review No results found.   MDM   1. Essential hypertension   2. Type 2 diabetes mellitus without complication        Linna Hoff, MD 04/13/14 1911

## 2014-04-13 NOTE — ED Notes (Signed)
Patient c/o hypertension , has run out of medication.

## 2014-04-21 ENCOUNTER — Ambulatory Visit: Payer: Self-pay | Admitting: Internal Medicine

## 2015-08-10 IMAGING — MG MM DIGITAL SCREENING BILAT W/ CAD
4 series · 4 of 4 positions shown · non-contrast
Comparison: none

[L MLO]
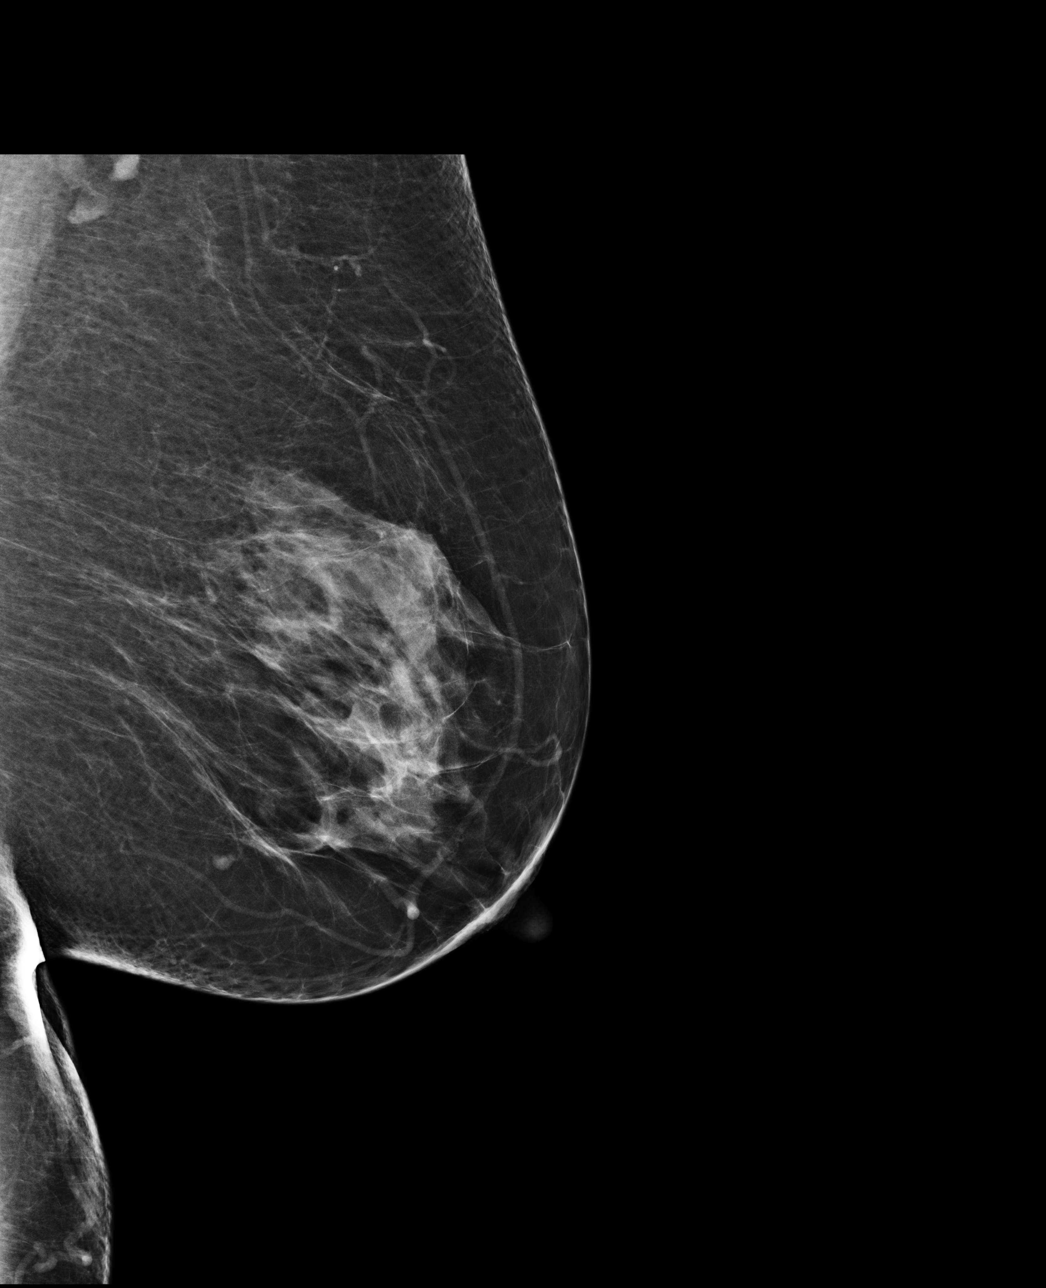

[L CC]
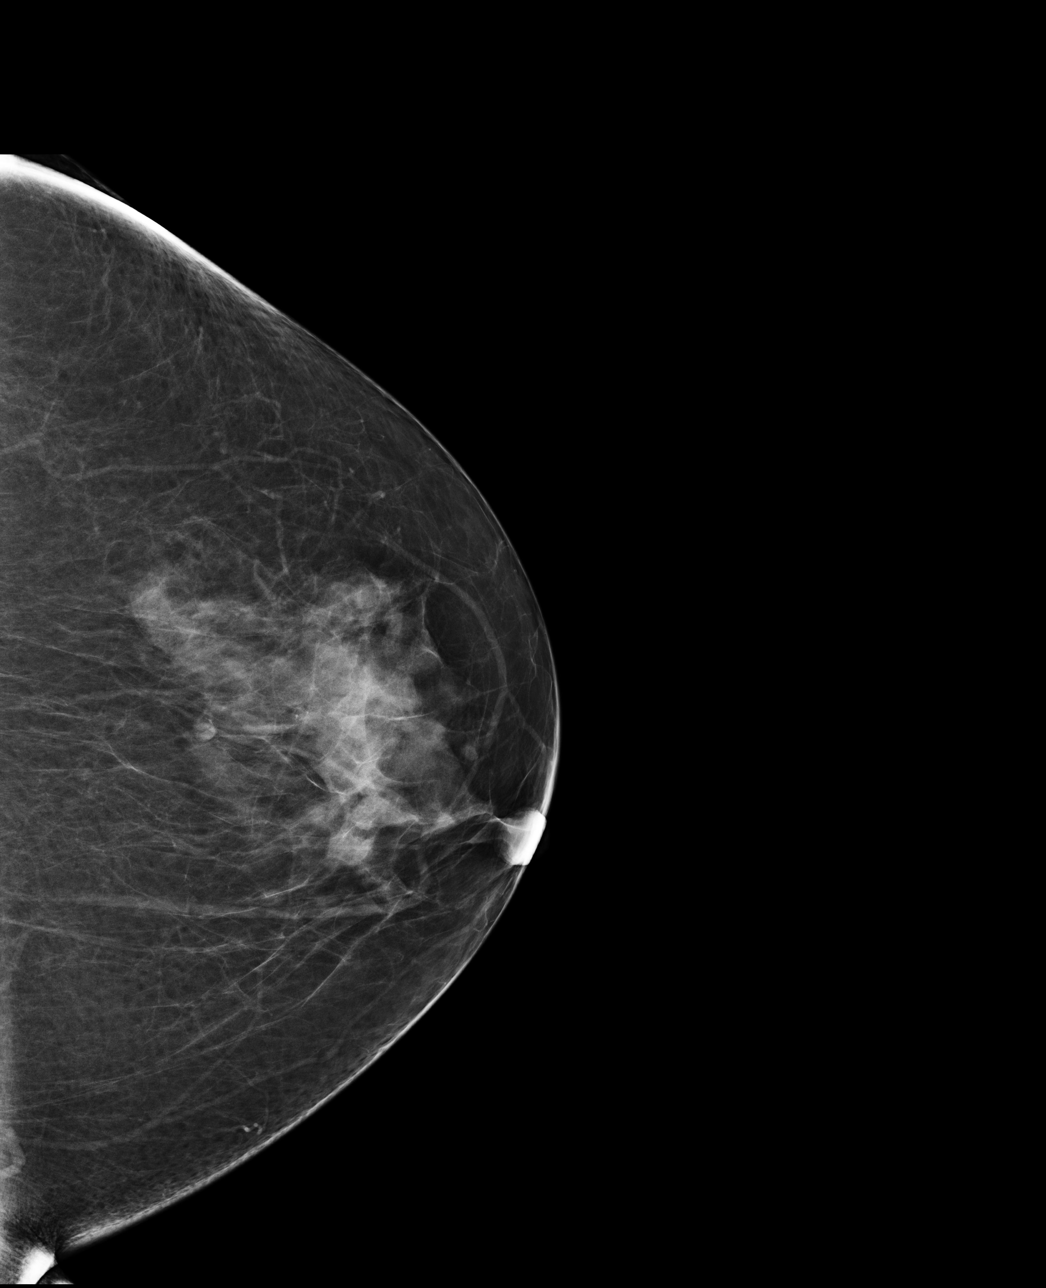

[R MLO]
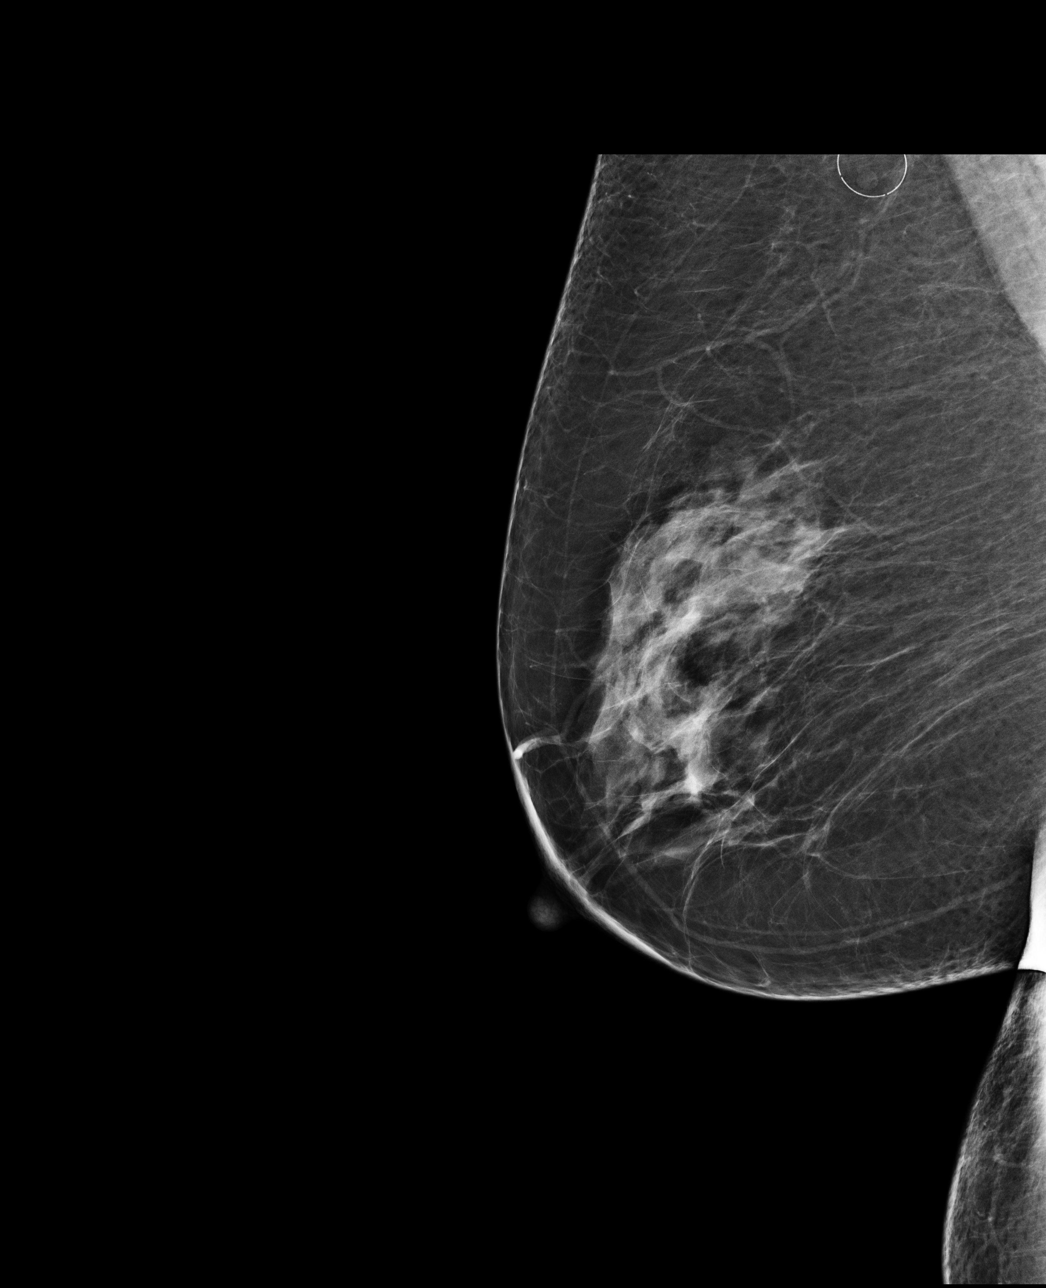

[R CC]
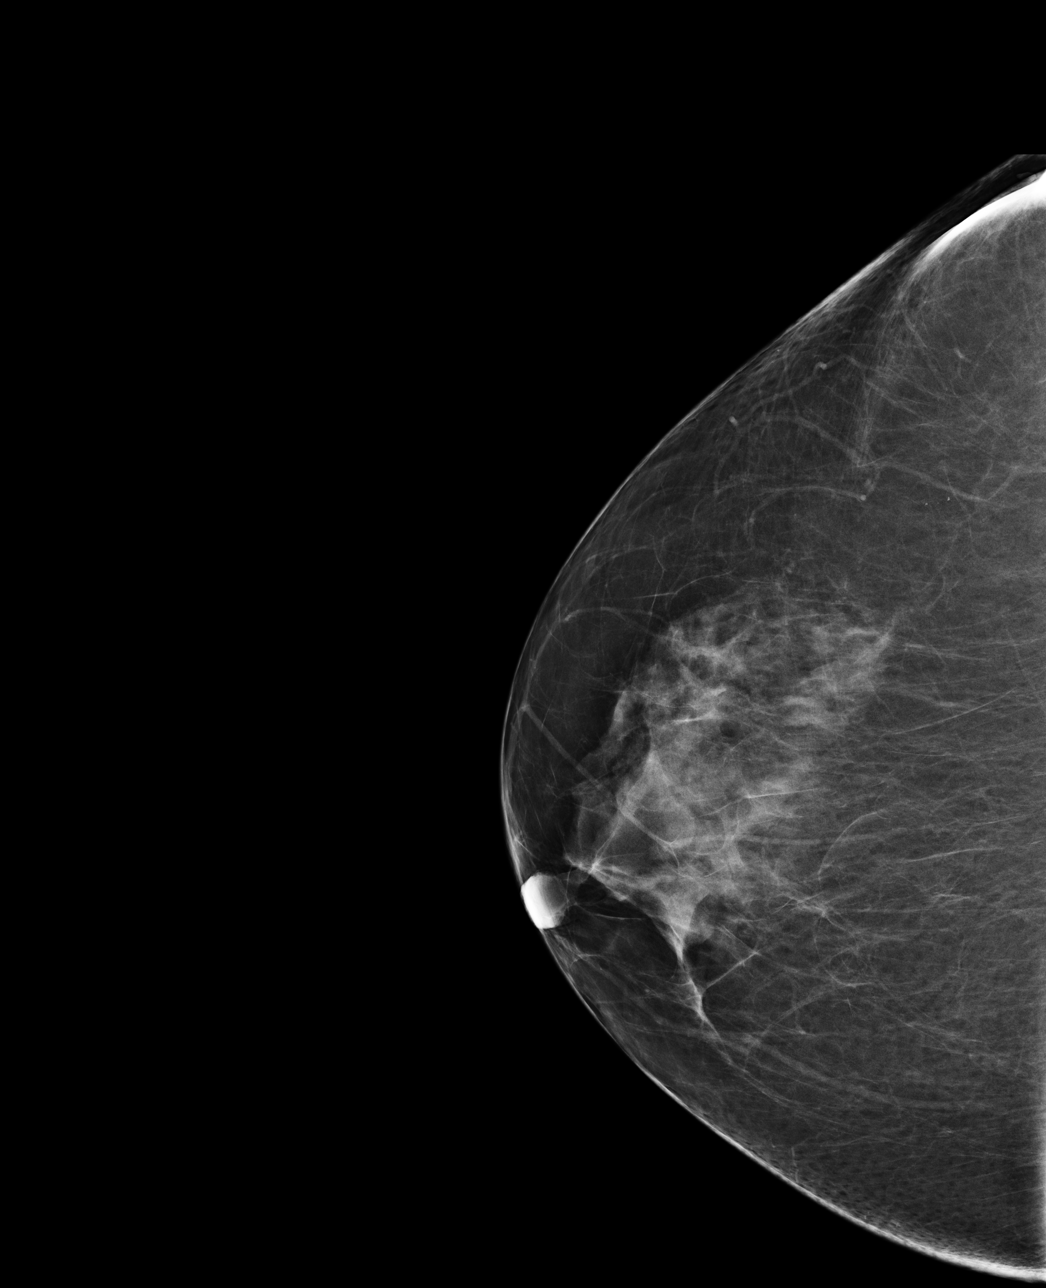

[4 of 4 positions shown; findings below may reference images not displayed]

CLINICAL DATA
Screening.

EXAM
DIGITAL SCREENING BILATERAL MAMMOGRAM WITH CAD

COMPARISON
Previous exam(s).

ACR BREAST DENSITY
ACR Breast Density Category c: The breast tissue is heterogeneously
dense, which may obscure small masses.

FINDINGS
There are no findings suspicious for malignancy. Images were
processed with CAD.

IMPRESSION
No mammographic evidence of malignancy. A result letter of this
screening mammogram will be mailed directly to the patient.

RECOMMENDATION
Screening mammogram in one year. (Code:2O-7-9FW)

BI-RADS CATEGORY
1: Negative.

SIGNATURE

## 2020-05-13 ENCOUNTER — Other Ambulatory Visit: Payer: Self-pay

## 2020-05-13 ENCOUNTER — Emergency Department (HOSPITAL_BASED_OUTPATIENT_CLINIC_OR_DEPARTMENT_OTHER): Payer: Medicare HMO

## 2020-05-13 ENCOUNTER — Encounter (HOSPITAL_BASED_OUTPATIENT_CLINIC_OR_DEPARTMENT_OTHER): Payer: Self-pay

## 2020-05-13 ENCOUNTER — Emergency Department (HOSPITAL_BASED_OUTPATIENT_CLINIC_OR_DEPARTMENT_OTHER)
Admission: EM | Admit: 2020-05-13 | Discharge: 2020-05-13 | Disposition: A | Discharge status: Home / Self Care | Payer: Medicare HMO | Attending: Emergency Medicine | Admitting: Emergency Medicine

## 2020-05-13 ENCOUNTER — Other Ambulatory Visit (HOSPITAL_BASED_OUTPATIENT_CLINIC_OR_DEPARTMENT_OTHER): Payer: Self-pay | Admitting: Medical

## 2020-05-13 DIAGNOSIS — Z7984 Long term (current) use of oral hypoglycemic drugs: Secondary | ICD-10-CM | POA: Insufficient documentation

## 2020-05-13 DIAGNOSIS — Z79899 Other long term (current) drug therapy: Secondary | ICD-10-CM | POA: Diagnosis not present

## 2020-05-13 DIAGNOSIS — W1839XA Other fall on same level, initial encounter: Secondary | ICD-10-CM | POA: Insufficient documentation

## 2020-05-13 DIAGNOSIS — R0781 Pleurodynia: Secondary | ICD-10-CM | POA: Diagnosis present

## 2020-05-13 DIAGNOSIS — I10 Essential (primary) hypertension: Secondary | ICD-10-CM | POA: Diagnosis not present

## 2020-05-13 DIAGNOSIS — Z87891 Personal history of nicotine dependence: Secondary | ICD-10-CM | POA: Insufficient documentation

## 2020-05-13 DIAGNOSIS — W19XXXA Unspecified fall, initial encounter: Secondary | ICD-10-CM

## 2020-05-13 DIAGNOSIS — E119 Type 2 diabetes mellitus without complications: Secondary | ICD-10-CM | POA: Diagnosis not present

## 2020-05-13 MED ORDER — ACETAMINOPHEN 500 MG PO TABS: 500.0000 mg | ORAL_TABLET | Freq: Four times a day (QID) | ORAL | 0 refills | Status: DC | PRN

## 2020-05-13 MED ORDER — DICLOFENAC SODIUM 1 % EX GEL: 2.0000 g | Freq: Four times a day (QID) | CUTANEOUS | 0 refills | Status: DC

## 2020-05-13 MED ORDER — METHOCARBAMOL 500 MG PO TABS: 500.0000 mg | ORAL_TABLET | Freq: Two times a day (BID) | ORAL | 0 refills | Status: DC

## 2020-05-13 MED FILL — ACETAMINOPHEN EXTRA STRENGT: 500 | 25 days supply | Qty: 100 | Fill #0

## 2020-05-13 MED FILL — METHOCARBAMOL 500 MG TABS: 500 | 10 days supply | Qty: 20 | Fill #0

## 2020-05-13 MED FILL — DICLOFENAC SODIUM 1 % GEL: 1 | 25 days supply | Qty: 100 | Fill #0

## 2020-05-13 NOTE — ED Provider Notes (Signed)
[facility] EMERGENCY DEPARTMENT Provider Note   CSN: [id number] Arrival date & time: 05/13/20  1537     History Chief Complaint  Patient presents with  . Fall    Jasmine Holt is a 69 y.o. female with PMHx HTN, HLD, GERD, Diabetes, Crohn's disease, and alpha 1 antitrypsin deficiency who presents to the ED today with complaint of sudden onset, constant, sharp, bilateral rib pain s/p fall that occurred 10/10. Pt reports that she normally is unsteady on her feet and has quite frequent falls. She reports she was walking back into her apartment from taking out the trash when her legs gave out on her causing her to fall forward onto concrete. No LOC. Pt is not anticoagulated. Pt states that since then she has had pain to her bilateral ribs/epigastric area. The pain is exacerbated at nighttime when trying to lay flat and movement of any kind. She has been taking Tylenol with mild relief. Pt reports that she is unable to drive and does not have family in the area so she has been unable to come to be evaluated since then. She does mention that a few of her front teeth were knocked out on impact so she saw her dentist the next day on 10/11. She reports that he sutured up her sockets and she was supposed to see him for follow up yesterday however did not have anyone to drive her so she has another appointment this Tuesday. She states she is supposed to be on Amoxicillin to cover for infection but has also not been able to go to the pharmacy to pick it up. Pt denies any headache, neck pain, midline back pain, lower abdominal pain, nausea, vomiting, blood in urine, extremity pain, or any other associated symptoms.   Per chart review: Telephone encounter with PCP today regarding fall. It appears pt stated that she fell across a piece of furniture and states she heard bones "Cracking" and felt severe pain. She was advised to come to the ED for further evaluation.   The history is provided by the  patient and medical records.       Past Medical History:  Diagnosis Date  . Allergic rhinitis   . Alpha-1-antitrypsin deficiency (HCC)    type ZZ  . Crohn disease (HCC)   . Depression   . Diabetes mellitus   . GERD (gastroesophageal reflux disease)   . Hyperlipidemia   . Hypertension   . Migraines   . OSA (obstructive sleep apnea)     Patient Active Problem List   Diagnosis Date Noted  . Diabetes (HCC) 08/20/2013  . Hypertension 08/20/2013  . Visit for preventive health examination 08/20/2013  . Other and unspecified hyperlipidemia 08/20/2013  . Crohn disease (HCC) 08/20/2013  . Alpha-1-antitrypsin deficiency (HCC) 05/05/2011  . OSA (obstructive sleep apnea) 03/31/2011    Past Surgical History:  Procedure Laterality Date  . ACHILLES TENDON REPAIR  2009   x2  . ACHILLES TENDON SURGERY  01/04/2012   Procedure: ACHILLES TENDON REPAIR;  Surgeon: Loreta Ave, MD;  Location: [facility];  Service: Orthopedics;  Laterality: Left;  repair rupture achilles tendon primary open, excision bone cyst/benign tumor talus/calcaneous  . COLONOSCOPY    . TONSILLECTOMY    . TOTAL ABDOMINAL HYSTERECTOMY       OB History   No obstetric history on file.     Family History  Problem Relation Age of Onset  . Alpha-1 antitrypsin deficiency Other  all siblings have ZZ type  . Heart disease Paternal Grandfather   . Heart disease Father   . Stroke Mother   . Alzheimer's disease Mother   . Alcohol abuse Brother     Social History   Tobacco Use  . Smoking status: Former Smoker    Packs/day: 7.00    Years: 1.00    Pack years: 7.00    Types: Cigarettes    Quit date: 07/24/1980    Years since quitting: 39.8  . Smokeless tobacco: Never Used  Vaping Use  . Vaping Use: Never used  Substance Use Topics  . Alcohol use: No  . Drug use: No    Home Medications Prior to Admission medications   Medication Sig Start Date End Date Taking? Authorizing Provider    acetaminophen (TYLENOL) 500 MG tablet Take 1 tablet (500 mg total) by mouth every 6 (six) hours as needed. 05/13/20   Zamzam Whinery, PA-C  atorvastatin (LIPITOR) 20 MG tablet Take 1 tablet (20 mg total) by mouth daily. 01/12/14   Waldon Merl, PA-C  clonazePAM (KLONOPIN) 0.5 MG tablet Take 0.5 mg by mouth 2 (two) times daily as needed.    [provider]  diclofenac Sodium (VOLTAREN) 1 % GEL Apply 2 g topically 4 (four) times daily. 05/13/20   Tanda Rockers, PA-C  DULoxetine (CYMBALTA) 60 MG capsule Take 60 mg by mouth daily.      [provider]  lamoTRIgine (LAMICTAL) 200 MG tablet Take 200 mg by mouth daily.      [provider]  lisinopril-hydrochlorothiazide (PRINZIDE,ZESTORETIC) 20-25 MG per tablet Take 1 tablet by mouth daily. 02/20/14   Waldon Merl, PA-C  lisinopril-hydrochlorothiazide (PRINZIDE,ZESTORETIC) 20-25 MG per tablet Take 1 tablet by mouth daily. 04/13/14   Linna Hoff, MD  metFORMIN (GLUCOPHAGE) 500 MG tablet Take 1 tablet (500 mg total) by mouth 2 (two) times daily. 02/20/14   Waldon Merl, PA-C  metFORMIN (GLUCOPHAGE) 500 MG tablet Take 1 tablet (500 mg total) by mouth 2 (two) times daily with a meal. 04/13/14   Kindl, Quita Skye, MD  methocarbamol (ROBAXIN) 500 MG tablet Take 1 tablet (500 mg total) by mouth 2 (two) times daily. 05/13/20   Tanda Rockers, PA-C  traZODone (DESYREL) 50 MG tablet Take 50 mg by mouth at bedtime.      [provider]    Allergies    Codeine and Other  Review of Systems   Review of Systems  Constitutional: Negative for chills and fever.  Gastrointestinal: Negative for nausea and vomiting.  Genitourinary: Negative for hematuria.  Musculoskeletal: Positive for arthralgias (bilateral rib pain).  Skin: Negative for color change (ecchymosis).  Neurological: Negative for syncope and headaches.  All other systems reviewed and are negative.   Physical Exam Updated Vital Signs BP (!) 161/85  (BP Location: Left Arm)   Pulse (!) 106   Temp 98.2 F (36.8 C) (Oral)   Resp 20   Ht 5\' 4"  (1.626 m)   Wt 99.8 kg   SpO2 98%   BMI 37.76 kg/m   Physical Exam Vitals and nursing note reviewed.  Constitutional:      Appearance: She is obese. She is not ill-appearing or diaphoretic.  HENT:     Head: Normocephalic.     Comments: Multiple missing teeth to the upper gum line with sutures in place from recent dental procedure Eyes:     Conjunctiva/sclera: Conjunctivae normal.  Neck:     Comments: No C midline spinal  TTP Cardiovascular:     Rate and Rhythm: Normal rate and regular rhythm.     Pulses: Normal pulses.  Pulmonary:     Effort: Pulmonary effort is normal.     Breath sounds: Normal breath sounds. No wheezing, rhonchi or rales.     Comments: Able to speak in full sentences without difficulty. Satting 98% on RA. LCTAB.   + bilateral lower anterior/lateral rib TTP as well as posterior rib TTP Chest:     Chest wall: Tenderness present.  Abdominal:     Palpations: Abdomen is soft.     Tenderness: There is no abdominal tenderness. There is no guarding or rebound.     Comments: No ecchymosis noted to abdomen  Musculoskeletal:     Cervical back: Neck supple.     Comments: No T or L midline spinal TTP. No tenderness to all extremities. Moving all extremities without difficulty.   Skin:    General: Skin is warm and dry.  Neurological:     General: No focal deficit present.     Mental Status: She is alert and oriented to person, place, and time.     Cranial Nerves: No cranial nerve deficit.     ED Results / Procedures / Treatments   Labs (all labs ordered are listed, but only abnormal results are displayed) Labs Reviewed - No data to display  EKG None  Radiology DG Chest 2 View  Result Date: 05/13/2020 CLINICAL DATA:  Bilateral rib pain after fall several days ago. EXAM: CHEST - 2 VIEW COMPARISON:  None. FINDINGS: The heart size and mediastinal contours are within  normal limits. Both lungs are clear. No pneumothorax or pleural effusion is noted. The visualized skeletal structures are unremarkable. IMPRESSION: No active cardiopulmonary disease. Electronically Signed   By: Lupita Raider M.D.   On: 05/13/2020 16:19    Procedures Procedures (including critical care time)  Medications Ordered in ED Medications - No data to display  ED Course  I have reviewed the triage vital signs and the nursing notes.  Pertinent labs & imaging results that were available during my care of the patient were reviewed by me and considered in my medical decision making (see chart for details).    MDM Rules/Calculators/A&P                          69 year old female presenting to the ED today with complaint of bilateral rib pain s/p mechanical fall that occurred 10/10. Pt has been unable to leave the house since then as she does not drive and does not seem to have a good support system in the area. She called her PCP today who told her to come to the ED for further eval. On arrival pt is afebrile and nontachypneic. She is tachycardic in the low 100s however suspect this is related to pain. She appears to be in NAD. She is able to speak in full sentences without difficulty and satting 98% on RA. LCTAB. On exam pt does have diffuse lower rib TTP bilaterally. Anytime she moves she screams out in pain. No signs of head trauma at this time and no focal neuro deficits on exam. Pt does report multiple teeth were knocked out after the fall and she saw her dentist the next day who sutured up the sockets; she has sutures in place at this time without signs of infection to the gumline despite not picking up her abx yet. No tenderness  to extremities; no abdominal TTP; no other signs of trauma. Will plan for chest xray as I am suspicious for rib fractures at this time. Pt had this pain immediately after the fall and denies any SOB; I have low suspicion for PE at this time given her pain was  immediately upon impact.   Xray negative for acute abnormality. No signs of rib fractures appreciated. Do not feel she needs additional imaging at this time. Will plan to discharge with incentive spirometer and pain meds. Pt was evaluated by attending physician Dr. Anitra Lauth who agrees with plan. Pt stated to her that she does not do well with narcotic pain meds; it was agreed upon for muscle relaxers, tylenol, and diclofenac gel with PCP follow up. Meds have been sent and pt instructed to take as indicated. She is also instructed to pick up her abx from her dentist to take as indicated. Strict return precautions discussed. Pt is in agreement with plan and stable for discharge home.   This note was prepared using Dragon voice recognition software and may include unintentional dictation errors due to the inherent limitations of voice recognition software.  Final Clinical Impression(s) / ED Diagnoses Final diagnoses:  Fall, initial encounter  Rib pain    Rx / DC Orders ED Discharge Orders         Ordered    methocarbamol (ROBAXIN) 500 MG tablet  2 times daily        05/13/20 1641    acetaminophen (TYLENOL) 500 MG tablet  Every 6 hours PRN        05/13/20 1641    diclofenac Sodium (VOLTAREN) 1 % GEL  4 times daily        05/13/20 1641           Discharge Instructions     Your chest xray did not show any signs of broken ribs at this time. You may have some contusions (bruising) related to your fall.   Please use incentive spirometer as indicated to help prevent pneumonia from occurring given you are having pain with deep breathing.   Pick up medications and take as indicated. The muscle relaxer can make you drowsy - I would recommend taking this mostly at nighttime to help you sleep.   Follow up with your PCP regarding your ED visit today Follow up with your dentist as well. Please pick up the antibiotics he prescribed and take as indicated   Return to the ED for any worsening  symptoms including worsening pain, shortness of breath, coughing up blood, leg swelling, or any other new/concerning symptoms.        Tanda Rockers, PA-C 05/13/20 1647    Gwyneth Sprout, MD 05/13/20 2236

## 2020-05-13 NOTE — Discharge Instructions (Signed)
Your chest xray did not show any signs of broken ribs at this time. You may have some contusions (bruising) related to your fall.   Please use incentive spirometer as indicated to help prevent pneumonia from occurring given you are having pain with deep breathing.   Pick up medications and take as indicated. The muscle relaxer can make you drowsy - I would recommend taking this mostly at nighttime to help you sleep.   Follow up with your PCP regarding your ED visit today Follow up with your dentist as well. Please pick up the antibiotics he prescribed and take as indicated   Return to the ED for any worsening symptoms including worsening pain, shortness of breath, coughing up blood, leg swelling, or any other new/concerning symptoms.

## 2020-05-13 NOTE — ED Triage Notes (Addendum)
Pt states she fell 10/10-pain to bilat rib area/abd pain-NAD-slow gait with own rollator

## 2022-02-25 ENCOUNTER — Emergency Department (HOSPITAL_BASED_OUTPATIENT_CLINIC_OR_DEPARTMENT_OTHER)
Admission: EM | Admit: 2022-02-25 | Discharge: 2022-02-25 | Disposition: A | Discharge status: Home / Self Care | Payer: Medicare HMO | Attending: Emergency Medicine | Admitting: Emergency Medicine

## 2022-02-25 ENCOUNTER — Emergency Department (HOSPITAL_BASED_OUTPATIENT_CLINIC_OR_DEPARTMENT_OTHER): Payer: Medicare HMO

## 2022-02-25 ENCOUNTER — Encounter (HOSPITAL_BASED_OUTPATIENT_CLINIC_OR_DEPARTMENT_OTHER): Payer: Self-pay | Admitting: Emergency Medicine

## 2022-02-25 ENCOUNTER — Other Ambulatory Visit: Payer: Self-pay

## 2022-02-25 DIAGNOSIS — I10 Essential (primary) hypertension: Secondary | ICD-10-CM | POA: Insufficient documentation

## 2022-02-25 DIAGNOSIS — Z7984 Long term (current) use of oral hypoglycemic drugs: Secondary | ICD-10-CM | POA: Diagnosis not present

## 2022-02-25 DIAGNOSIS — S0101XA Laceration without foreign body of scalp, initial encounter: Secondary | ICD-10-CM | POA: Insufficient documentation

## 2022-02-25 DIAGNOSIS — D696 Thrombocytopenia, unspecified: Secondary | ICD-10-CM | POA: Diagnosis not present

## 2022-02-25 DIAGNOSIS — W19XXXA Unspecified fall, initial encounter: Secondary | ICD-10-CM | POA: Diagnosis not present

## 2022-02-25 DIAGNOSIS — I951 Orthostatic hypotension: Secondary | ICD-10-CM | POA: Insufficient documentation

## 2022-02-25 DIAGNOSIS — Z79899 Other long term (current) drug therapy: Secondary | ICD-10-CM | POA: Insufficient documentation

## 2022-02-25 DIAGNOSIS — M19011 Primary osteoarthritis, right shoulder: Secondary | ICD-10-CM | POA: Diagnosis not present

## 2022-02-25 DIAGNOSIS — S0990XA Unspecified injury of head, initial encounter: Secondary | ICD-10-CM | POA: Diagnosis present

## 2022-02-25 DIAGNOSIS — E119 Type 2 diabetes mellitus without complications: Secondary | ICD-10-CM | POA: Insufficient documentation

## 2022-02-25 LAB — URINALYSIS, ROUTINE W REFLEX MICROSCOPIC
Bilirubin Urine: NEGATIVE
Glucose, UA: NEGATIVE mg/dL
Hgb urine dipstick: NEGATIVE
Ketones, ur: NEGATIVE mg/dL
Leukocytes,Ua: NEGATIVE
Nitrite: NEGATIVE
Protein, ur: NEGATIVE mg/dL
Specific Gravity, Urine: 1.025 (ref 1.005–1.030)
pH: 5.5 (ref 5.0–8.0)

## 2022-02-25 LAB — CBC WITH DIFFERENTIAL/PLATELET
Abs Immature Granulocytes: 0.01 10*3/uL (ref 0.00–0.07)
Basophils Absolute: 0 10*3/uL (ref 0.0–0.1)
Basophils Relative: 0 %
Eosinophils Absolute: 0.1 10*3/uL (ref 0.0–0.5)
Eosinophils Relative: 2 %
HCT: 34.7 % — ABNORMAL LOW (ref 36.0–46.0)
Hemoglobin: 11.4 g/dL — ABNORMAL LOW (ref 12.0–15.0)
Immature Granulocytes: 0 %
Lymphocytes Relative: 20 %
Lymphs Abs: 1 10*3/uL (ref 0.7–4.0)
MCH: 28.3 pg (ref 26.0–34.0)
MCHC: 32.9 g/dL (ref 30.0–36.0)
MCV: 86.1 fL (ref 80.0–100.0)
Monocytes Absolute: 0.6 10*3/uL (ref 0.1–1.0)
Monocytes Relative: 11 %
Neutro Abs: 3.3 10*3/uL (ref 1.7–7.7)
Neutrophils Relative %: 67 %
Platelets: 138 10*3/uL — ABNORMAL LOW (ref 150–400)
RBC: 4.03 MIL/uL (ref 3.87–5.11)
RDW: 15.3 % (ref 11.5–15.5)
WBC: 5 10*3/uL (ref 4.0–10.5)
nRBC: 0 % (ref 0.0–0.2)

## 2022-02-25 LAB — COMPREHENSIVE METABOLIC PANEL
ALT: 17 U/L (ref 0–44)
AST: 33 U/L (ref 15–41)
Albumin: 3.5 g/dL (ref 3.5–5.0)
Alkaline Phosphatase: 97 U/L (ref 38–126)
Anion gap: 6 (ref 5–15)
BUN: 13 mg/dL (ref 8–23)
CO2: 22 mmol/L (ref 22–32)
Calcium: 8.3 mg/dL — ABNORMAL LOW (ref 8.9–10.3)
Chloride: 109 mmol/L (ref 98–111)
Creatinine, Ser: 0.65 mg/dL (ref 0.44–1.00)
GFR, Estimated: >60 mL/min (ref >60)
Glucose, Bld: 108 mg/dL — ABNORMAL HIGH (ref 70–99)
Potassium: 3.4 mmol/L — ABNORMAL LOW (ref 3.5–5.1)
Sodium: 137 mmol/L (ref 135–145)
Total Bilirubin: 1 mg/dL (ref 0.3–1.2)
Total Protein: 7.2 g/dL (ref 6.5–8.1)

## 2022-02-25 LAB — PROTIME-INR
INR: 1.2 (ref 0.8–1.2)
Prothrombin Time: 15.2 seconds (ref 11.4–15.2)

## 2022-02-25 MED ORDER — LIDOCAINE-EPINEPHRINE (PF) 2 %-1:200000 IJ SOLN: 10.0000 mL | Freq: Once | INTRAMUSCULAR | Status: DC

## 2022-02-25 MED ORDER — SODIUM CHLORIDE 0.9 % IV BOLUS
1000.0000 mL | Freq: Once | INTRAVENOUS | Status: AC
  Administered 2022-02-25: 1000 mL via INTRAVENOUS

## 2022-02-25 MED ORDER — SODIUM CHLORIDE 0.9 % IV BOLUS
1000.0000 mL | Freq: Once | INTRAVENOUS | Status: DC
Start: 2022-02-25 — End: 2022-02-25

## 2022-02-25 MED ORDER — LIDOCAINE-EPINEPHRINE-TETRACAINE (LET) TOPICAL GEL
3.0000 mL | Freq: Once | TOPICAL | Status: AC
  Administered 2022-02-25: 3 mL via TOPICAL
  Filled 2022-02-25: qty 3

## 2022-02-25 MED ORDER — SODIUM CHLORIDE 0.9 % IV BOLUS
500.0000 mL | Freq: Once | INTRAVENOUS | Status: AC
  Administered 2022-02-25: 500 mL via INTRAVENOUS

## 2022-02-25 NOTE — ED Provider Notes (Signed)
[facility] EMERGENCY DEPARTMENT Provider Note   CSN: [id number] Arrival date & time: 02/25/22  1344     History  Chief Complaint  Patient presents with   Jasmine Holt is a 71 y.o. female with a pmh of hypertension, Crohn's colitis, diabetes and alpha 1 antitrypsin deficiency presenting today after a fall.  She reports that she falls every day, 4 times yesterday and twice today.  She says that her legs "do not give her any warning before she falls."  No history of seizure/syncope.  No known arrhythmias.  She lives by herself and says that she has nobody to take care of her.  Is prescribed Klonopin but no longer takes it.  Does take trazodone nightly.   Fall       Home Medications Prior to Admission medications   Medication Sig Start Date End Date Taking? Authorizing Provider  atorvastatin (LIPITOR) 20 MG tablet Take 1 tablet (20 mg total) by mouth daily. 01/12/14   Waldon Merl, PA-C  clonazePAM (KLONOPIN) 0.5 MG tablet Take 0.5 mg by mouth 2 (two) times daily as needed.    [provider]  DULoxetine (CYMBALTA) 60 MG capsule Take 60 mg by mouth daily.      [provider]  lamoTRIgine (LAMICTAL) 200 MG tablet Take 200 mg by mouth daily.      [provider]  lisinopril-hydrochlorothiazide (PRINZIDE,ZESTORETIC) 20-25 MG per tablet Take 1 tablet by mouth daily. 02/20/14   Waldon Merl, PA-C  lisinopril-hydrochlorothiazide (PRINZIDE,ZESTORETIC) 20-25 MG per tablet Take 1 tablet by mouth daily. 04/13/14   Linna Hoff, MD  metFORMIN (GLUCOPHAGE) 500 MG tablet Take 1 tablet (500 mg total) by mouth 2 (two) times daily. 02/20/14   Waldon Merl, PA-C  metFORMIN (GLUCOPHAGE) 500 MG tablet Take 1 tablet (500 mg total) by mouth 2 (two) times daily with a meal. 04/13/14   Linna Hoff, MD  traZODone (DESYREL) 50 MG tablet Take 50 mg by mouth at bedtime.      [provider]      Allergies    Codeine and Other     Review of Systems   Review of Systems  Physical Exam Updated Vital Signs BP 126/78   Pulse 76   Temp 98 F (36.7 C)   Resp 19   Ht 5\' 4"  (1.626 m)   SpO2 97%   BMI 37.76 kg/m  Physical Exam Vitals and nursing note reviewed.  Constitutional:      General: She is not in acute distress.    Appearance: Normal appearance. She is not ill-appearing.  HENT:     Head: Normocephalic.     Comments: 1 cm laceration to the left temporal scalp.    Nose: Nose normal.     Mouth/Throat:     Mouth: Mucous membranes are moist.     Pharynx: Oropharynx is clear.     Comments: No trauma in the oropharynx Eyes:     General: No scleral icterus.    Conjunctiva/sclera: Conjunctivae normal.  Cardiovascular:     Rate and Rhythm: Normal rate and regular rhythm.  Pulmonary:     Effort: Pulmonary effort is normal. No respiratory distress.  Abdominal:     General: Abdomen is flat.     Palpations: Abdomen is soft.     Tenderness: There is no abdominal tenderness.     Comments: No bruising or lacerations  Musculoskeletal:     Comments: Tenderness to the right shoulder  Skin:    General: Skin is warm and dry.     Findings: No rash.  Neurological:     General: No focal deficit present.     Mental Status: She is alert and oriented to person, place, and time.  Psychiatric:        Mood and Affect: Mood normal.     ED Results / Procedures / Treatments   Labs (all labs ordered are listed, but only abnormal results are displayed) Labs Reviewed  COMPREHENSIVE METABOLIC PANEL - Abnormal; Notable for the following components:      Result Value   Potassium 3.4 (*)    Glucose, Bld 108 (*)    Calcium 8.3 (*)    All other components within normal limits  CBC WITH DIFFERENTIAL/PLATELET - Abnormal; Notable for the following components:   Hemoglobin 11.4 (*)    HCT 34.7 (*)    Platelets 138 (*)    All other components within normal limits  URINALYSIS, ROUTINE W REFLEX MICROSCOPIC  PROTIME-INR     EKG EKG Interpretation  Date/Time:  Saturday February 25 2022 14:01:59 EDT Ventricular Rate:  77 PR Interval:  153 QRS Duration: 95 QT Interval:  412 QTC Calculation: 467 R Axis:   17 Text Interpretation: Sinus rhythm Low voltage, extremity and precordial leads No significant change since last tracing Confirmed by Meridee Score [phone]) on 02/25/2022 2:42:28 PM  Radiology DG Shoulder Right  Result Date: 02/25/2022 CLINICAL DATA:  Fall, pain EXAM: RIGHT SHOULDER - 2+ VIEW COMPARISON:  None Available. FINDINGS: There is no evidence of fracture or dislocation. Mild acromioclavicular and glenohumeral arthrosis. Soft tissues are unremarkable. IMPRESSION: No fracture or dislocation of the right shoulder. Mild acromioclavicular and glenohumeral arthrosis. Electronically Signed   By: Jearld Lesch M.D.   On: 02/25/2022 16:06   DG Chest 1 View  Result Date: 02/25/2022 CLINICAL DATA:  Status post 2 falls. EXAM: CHEST  1 VIEW COMPARISON:  April 28, 2021 FINDINGS: Tortuosity of the aorta. Elevation of the right hemidiaphragm. Cardiomediastinal silhouette is normal. Mediastinal contours appear intact. There is no evidence of focal airspace consolidation, pleural effusion or pneumothorax. Osseous structures are without acute abnormality. Soft tissues are grossly normal. IMPRESSION: 1. Elevation of the right hemidiaphragm. 2. Tortuosity of the aorta. 3. No fractures seen. Electronically Signed   By: Ted Mcalpine M.D.   On: 02/25/2022 16:05   DG Pelvis 1-2 Views  Result Date: 02/25/2022 CLINICAL DATA:  Fall, pain EXAM: PELVIS - 1-2 VIEW COMPARISON:  None Available. FINDINGS: There is no evidence of displaced pelvic fracture or diastasis. No pelvic bone lesions are seen. Nonobstructive pattern of overlying bowel gas. IMPRESSION: No displaced fracture or dislocation of the pelvis or bilateral proximal femurs seen in single frontal view. Electronically Signed   By: Jearld Lesch M.D.   On: 02/25/2022  16:05   CT Head Wo Contrast  Result Date: 02/25/2022 CLINICAL DATA:  Head trauma, minor (Age >= 65y); Neck trauma (Age >= 65y) EXAM: CT HEAD WITHOUT CONTRAST CT CERVICAL SPINE WITHOUT CONTRAST TECHNIQUE: Multidetector CT imaging of the head and cervical spine was performed following the standard protocol without intravenous contrast. Multiplanar CT image reconstructions of the cervical spine were also generated. RADIATION DOSE REDUCTION: This exam was performed according to the departmental dose-optimization program which includes automated exposure control, adjustment of the mA and/or kV according to patient size and/or use of iterative reconstruction technique. COMPARISON:  CT dated September 13, 2018 FINDINGS: CT HEAD FINDINGS Brain: No evidence of acute  infarction, hemorrhage, hydrocephalus, extra-axial collection or mass lesion/mass effect. Vascular: No hyperdense vessel or unexpected calcification. Skull: No acute fracture. Revisualization of sequela of remote comminuted nasal bone fracture. Sinuses/Orbits: No acute finding. Other: Soft tissue air in the LEFT lateral superficial facial tissues consistent with sequela of injury. CT CERVICAL SPINE FINDINGS Alignment: Straightening of the cervical lordosis. No acute spondylolisthesis. Skull base and vertebrae: No acute fracture. 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 Soft tissues and spinal canal: No prevertebral fluid or swelling. No visible canal hematoma. Disc levels: Mild intervertebral disc space height loss at C5-6 and C6-7. Multilevel uncovertebral hypertrophy and facet arthropathy. No high-grade canal stenosis. There is neuroforaminal narrowing at LEFT C3-4 and C4-5. Upper chest: Negative. Other: None IMPRESSION: 1.  No acute intracranial abnormality. 2. No acute fracture or static subluxation of the cervical spine. Electronically Signed   By: Meda Klinefelter M.D.   On: 02/25/2022 15:53   CT Cervical Spine Wo Contrast  Result  Date: 02/25/2022 CLINICAL DATA:  Head trauma, minor (Age >= 65y); Neck trauma (Age >= 65y) EXAM: CT HEAD WITHOUT CONTRAST CT CERVICAL SPINE WITHOUT CONTRAST TECHNIQUE: Multidetector CT imaging of the head and cervical spine was performed following the standard protocol without intravenous contrast. Multiplanar CT image reconstructions of the cervical spine were also generated. RADIATION DOSE REDUCTION: This exam was performed according to the departmental dose-optimization program which includes automated exposure control, adjustment of the mA and/or kV according to patient size and/or use of iterative reconstruction technique. COMPARISON:  CT dated September 13, 2018 FINDINGS: CT HEAD FINDINGS Brain: No evidence of acute infarction, hemorrhage, hydrocephalus, extra-axial collection or mass lesion/mass effect. Vascular: No hyperdense vessel or unexpected calcification. Skull: No acute fracture. Revisualization of sequela of remote comminuted nasal bone fracture. Sinuses/Orbits: No acute finding. Other: Soft tissue air in the LEFT lateral superficial facial tissues consistent with sequela of injury. CT CERVICAL SPINE FINDINGS Alignment: Straightening of the cervical lordosis. No acute spondylolisthesis. Skull base and vertebrae: No acute fracture. 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 Soft tissues and spinal canal: No prevertebral fluid or swelling. No visible canal hematoma. Disc levels: Mild intervertebral disc space height loss at C5-6 and C6-7. Multilevel uncovertebral hypertrophy and facet arthropathy. No high-grade canal stenosis. There is neuroforaminal narrowing at LEFT C3-4 and C4-5. Upper chest: Negative. Other: None IMPRESSION: 1.  No acute intracranial abnormality. 2. No acute fracture or static subluxation of the cervical spine. Electronically Signed   By: Meda Klinefelter M.D.   On: 02/25/2022 15:53    Procedures .Marland KitchenLaceration Repair  Date/Time: 02/25/2022 5:43 PM  Performed by:  Saddie Benders, PA-C Authorized by: Saddie Benders, PA-C   Consent:    Consent obtained:  Verbal   Consent given by:  Patient   Risks discussed:  Pain Universal protocol:    Test results available: yes   Anesthesia:    Anesthesia method:  Topical application Laceration details:    Location:  Scalp   Scalp location:  L temporal   Length (cm):  1 Exploration:    Hemostasis achieved with:  Direct pressure   Imaging outcome: foreign body not noted     Wound exploration: wound explored through full range of motion     Contaminated: no   Treatment:    Area cleansed with:  Chlorhexidine and saline   Irrigation solution:  Sterile saline   Debridement:  None   Undermining:  None Skin repair:    Repair method:  Staples   Number of staples:  2 Approximation:    Approximation:  Close Repair type:    Repair type:  Simple Post-procedure details:    Dressing:  Open (no dressing)   Procedure completion:  Tolerated well, no immediate complications    Medications Ordered in ED Medications  sodium chloride 0.9 % bolus 500 mL (has no administration in time range)  lidocaine-EPINEPHrine-tetracaine (LET) topical gel (3 mLs Topical Given 02/25/22 1655)  sodium chloride 0.9 % bolus 1,000 mL (1,000 mLs Intravenous New Bag/Given 02/25/22 1817)    ED Course/ Medical Decision Making/ A&P Clinical Course as of 02/25/22 1848  Sat Feb 25, 2022  1814 Patient failed orthostatic vital signs.  Also with very poor and dangerous ambulation.  Fluids ordered [MR]    Clinical Course User Index [MR] Tuff Clabo, Gabriel Cirri, PA-C                           Medical Decision Making Amount and/or Complexity of Data Reviewed Labs: ordered. Radiology: ordered.   This patient presents to the ED for concern of multiple falls and head laceration.  Differential includes but is not limited to seizure, syncope, hypoglycemia, ACS, PE, brain mass, dementia, hydrocephalus.   This is not an exhaustive differential.     Past Medical History / Co-morbidities / Social History: Crohn's, hypertension, diabetes and antitrypsin deficiency   Additional history: Patient has not been seen due to falls in multiple years.  She does report that she has been falling nearly every day just does not go to be seen.   Physical Exam: Pertinent physical exam findings include laceration to left temporal scalp.  Old bruise to left anterior leg.  Normal neurological exam  Lab Tests: I ordered, and personally interpreted labs.  No pertinent findings.  Mild thrombocytopenia to 138.  Imaging Studies: I ordered and independently visualized and interpreted CT head and neck, x-ray pelvis and chest and x-ray of right shoulder.  These showed right shoulder arthritis.  No other findings. I agree with the radiologist interpretation.   Cardiac Monitoring:  The patient was maintained on a cardiac monitor.  My attending physician Dr. Rhunette Croft viewed and interpreted the cardiac monitored which showed an underlying rhythm of: Normal sinus   Medications: Declined any pain or nausea medications.  Was given fluids after positive orthostatics  MDM/Disposition: 71 year old female presenting after a fall.  She is unsure why she fell but reports that she is having multiple falls every day.  Lives by herself.  She is accompanied by a friend who is a retired Librarian, academic.  He reports that they are trying to get her into a safer apartment or rehab facility.  She reports that she would like to stay in her own home.  She had a small laceration to the left temporal skull.  This was easily repaired with 2 staples.  Lab work was unremarkable.  X-rays and CTs are without fracture or hemorrhage.  Prior to discharge nurse tech was ambulating and doing orthostatics with the patient.  She was noted to be unsteady on her feet.  Orthostatics from lying to sitting was normal however when patient stood up her pressure dropped to 96/36.  At this time, after discussion with  the patient and her friend, I have placed a face-to-face home health referral.  Patient is a fall risk and it is unsafe for her to live by herself.  At this time I have no indication for admission being  that her labs are stable and her imaging is negative.  She is able to ambulate, is just unsteady on her feet.  She has a walker but she does not use it consistently.  We discussed that she must be consistent with this to avoid further falls and that ultimately she needs to consider moving into a different facility.  She will be given 2 L of fluids for her orthostasis and discharged home.  Second liter of fluids pending at shift change.  Patient has been placed is pending discharge.  She will be discharged after finishing her fluids.  I discussed this case with my attending physician Dr. Rhunette Croft who cosigned this note including patient's presenting symptoms, physical exam, and planned diagnostics and interventions. Attending physician stated agreement with plan or made changes to plan which were implemented.     Final Clinical Impression(s) / ED Diagnoses Final diagnoses:  Fall, initial encounter  Laceration of scalp, initial encounter  Orthostasis    Rx / DC Orders ED Discharge Orders     None      Results and diagnoses were explained to the patient. Return precautions discussed in full. Patient had no additional questions and expressed complete understanding.   This chart was dictated using voice recognition software.  Despite best efforts to proofread,  errors can occur which can change the documentation meaning.    Saddie Benders, PA-C 02/25/22 Thyra Breed, MD 02/26/22 1701

## 2022-02-25 NOTE — ED Notes (Signed)
Attempted to get labs without success ,

## 2022-02-25 NOTE — ED Triage Notes (Signed)
Reports multiple mechanical falls over the past week. Endorses balance issues over the past year. No new weakness. Denies loc, blood thinners, neck pain, hip pain. Endorses upper back pain. Pt has multiple abrasions on head and bruises on various areas of her body.

## 2022-02-25 NOTE — ED Notes (Signed)
Ambulate PT very unsteady gait and took three steps assisted.

## 2022-02-25 NOTE — Discharge Instructions (Addendum)
Your staples removed in 5 to 7 days.  You may do this with your PCP, urgent care or if necessary you may return to the emergency department.  Your blood pressure is also dropping when you stand up.  Be sure to stay hydrated, stand up very slowly and wait before ambulating and always use your walker. Also, please look out for communication via telephone about your need for home health.  Return with any repeated falls and please call your PCP on Monday and let him know about your visit today and recurring falls.

## 2022-03-23 IMAGING — CR DG CHEST 2V
2 series · 2 of 2 positions shown · non-contrast
Comparison: None.

CLINICAL DATA: Bilateral rib pain after fall several days ago.

EXAM:
CHEST - 2 VIEW

[w chest pa]
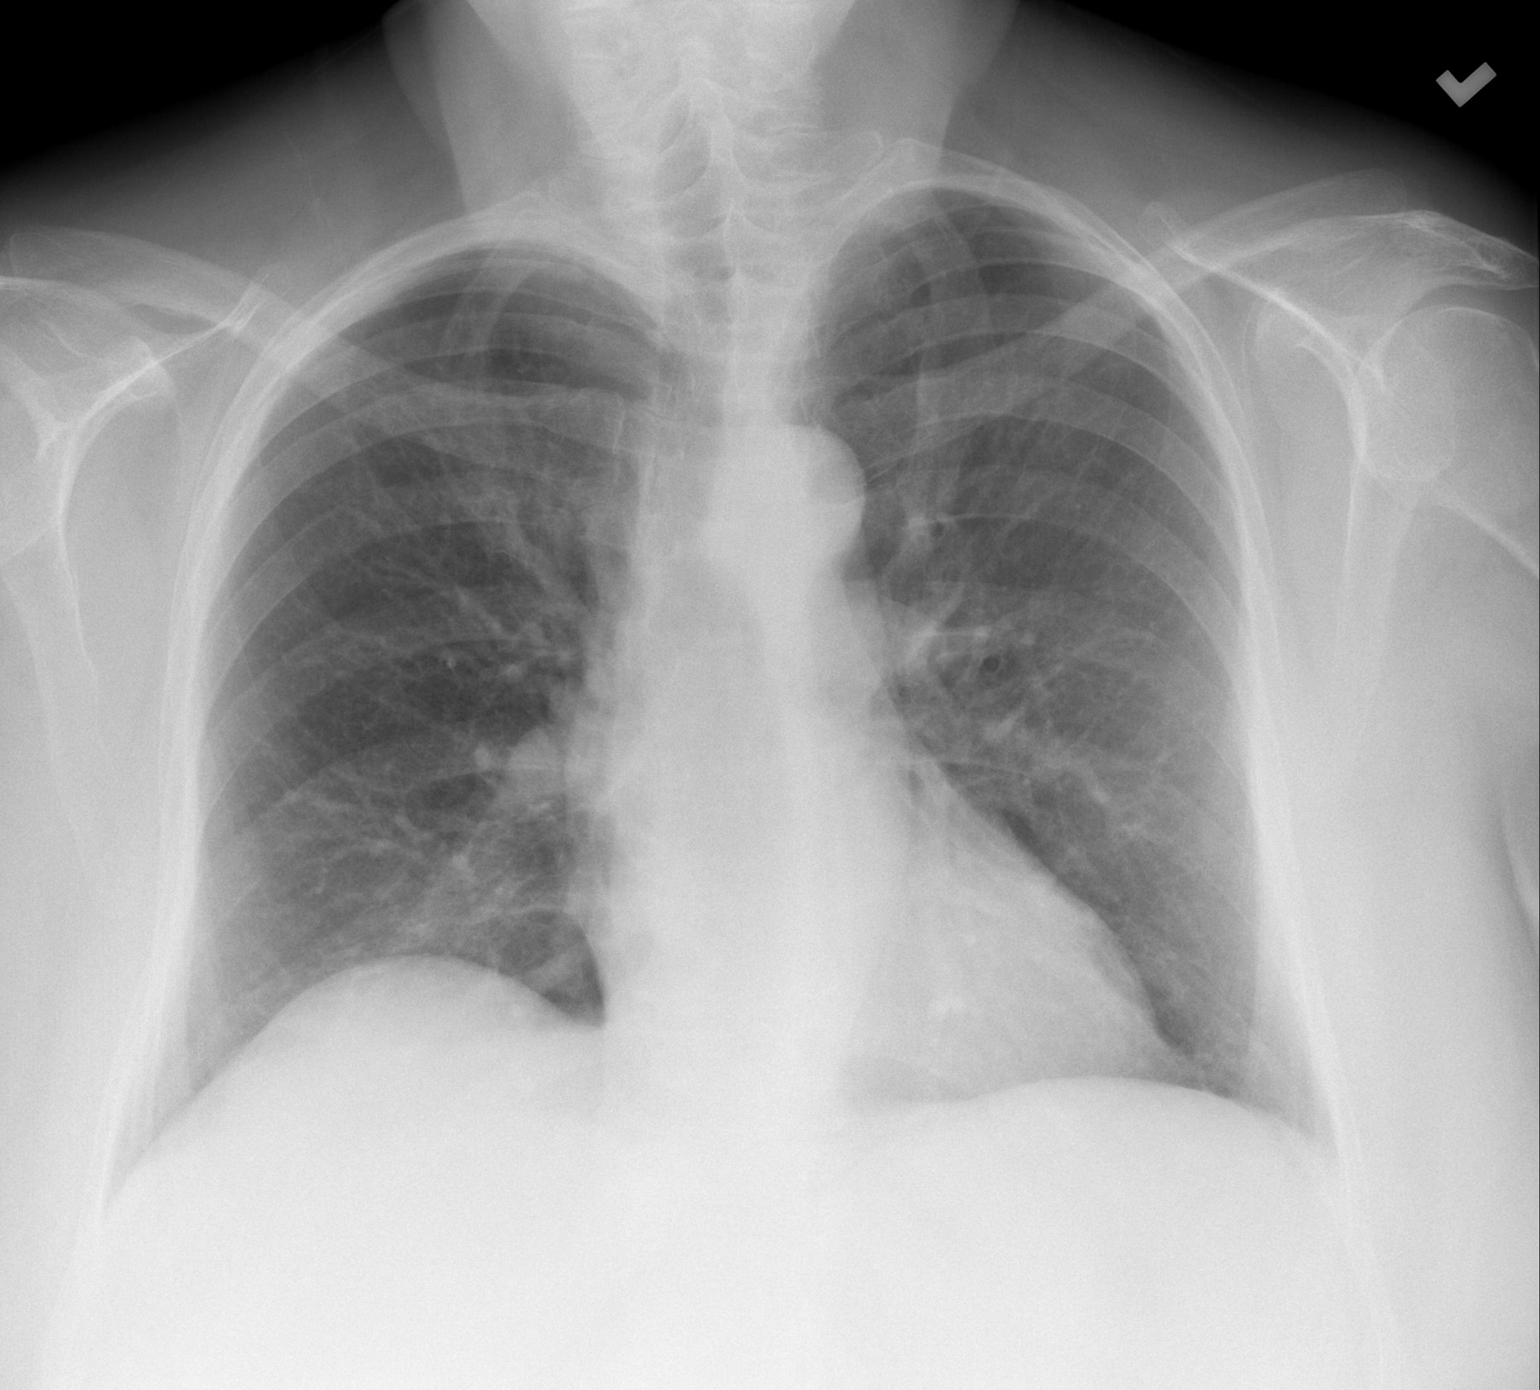

[w chest lat]
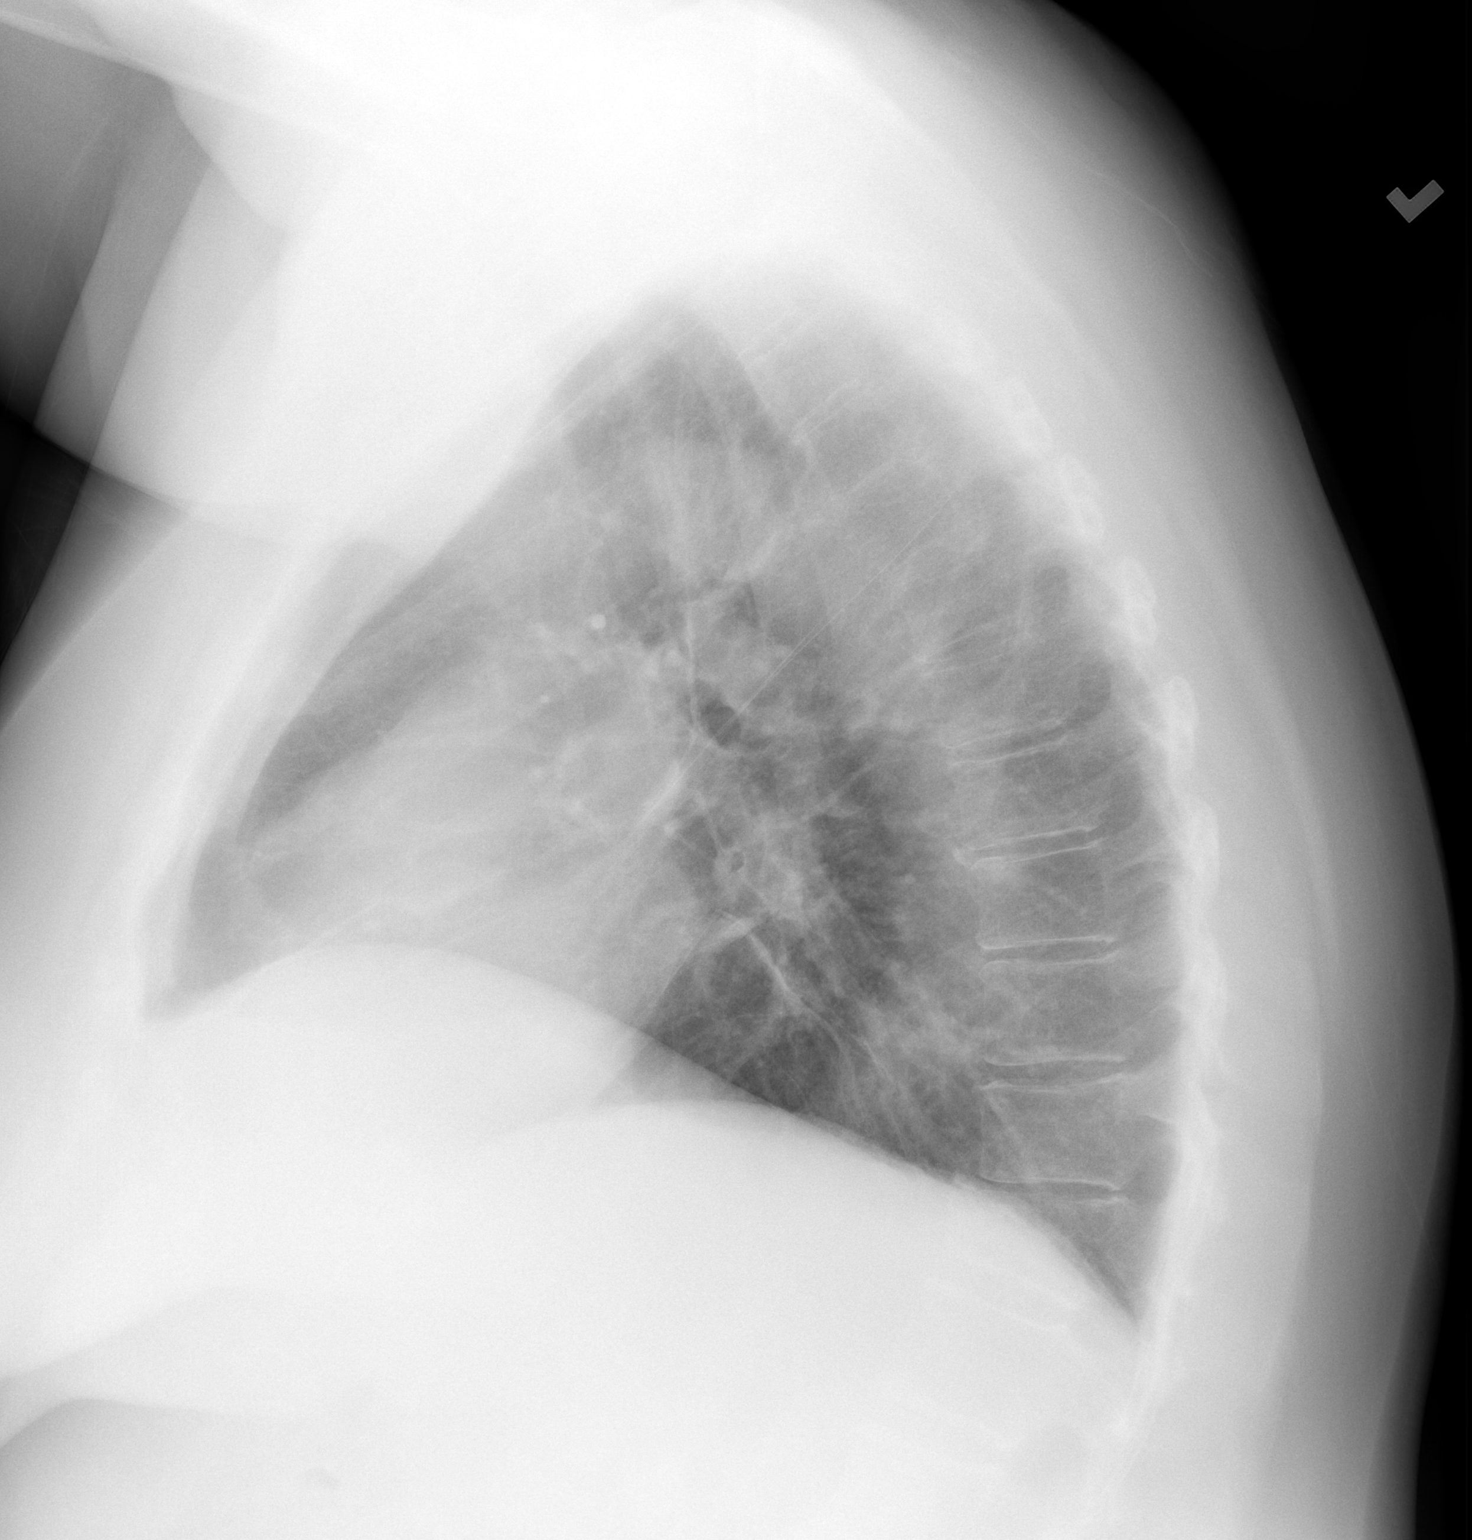

[2 of 2 positions shown; findings below may reference images not displayed]

FINDINGS: The heart size and mediastinal contours are within normal limits.
Both lungs are clear. No pneumothorax or pleural effusion is noted.
The visualized skeletal structures are unremarkable.
IMPRESSION: No active cardiopulmonary disease.

## 2023-07-25 DEATH — deceased
